# Patient Record
Sex: Female | Born: 1975 | Race: Black or African American | Hispanic: No | Marital: Single | Smoking: Current every day smoker
Health system: Southern US, Community
[De-identification: ages and names within clinical notes are randomized; demographics above are authoritative.]

## PROBLEM LIST (undated history)

## (undated) DIAGNOSIS — J45909 Unspecified asthma, uncomplicated: Secondary | ICD-10-CM

## (undated) DIAGNOSIS — D649 Anemia, unspecified: Secondary | ICD-10-CM

## (undated) HISTORY — PX: MULTIPLE TOOTH EXTRACTIONS: SHX2053

---

## 1997-10-11 ENCOUNTER — Inpatient Hospital Stay (HOSPITAL_COMMUNITY): Admission: AD | Admit: 1997-10-11 | Discharge: 1997-10-11 | Payer: Self-pay | Admitting: Obstetrics

## 1997-12-23 ENCOUNTER — Inpatient Hospital Stay (HOSPITAL_COMMUNITY): Admission: AD | Admit: 1997-12-23 | Discharge: 1997-12-25 | Payer: Self-pay | Admitting: *Deleted

## 1998-03-27 ENCOUNTER — Inpatient Hospital Stay (HOSPITAL_COMMUNITY): Admission: AD | Admit: 1998-03-27 | Discharge: 1998-03-27 | Payer: Self-pay | Admitting: *Deleted

## 1998-07-10 ENCOUNTER — Inpatient Hospital Stay (HOSPITAL_COMMUNITY): Admission: AD | Admit: 1998-07-10 | Discharge: 1998-07-10 | Payer: Self-pay | Admitting: *Deleted

## 1998-10-22 ENCOUNTER — Emergency Department (HOSPITAL_COMMUNITY): Admission: EM | Admit: 1998-10-22 | Discharge: 1998-10-22 | Payer: Self-pay | Admitting: Emergency Medicine

## 1999-06-17 ENCOUNTER — Inpatient Hospital Stay (HOSPITAL_COMMUNITY): Admission: AD | Admit: 1999-06-17 | Discharge: 1999-06-17 | Payer: Self-pay | Admitting: *Deleted

## 1999-12-22 ENCOUNTER — Inpatient Hospital Stay (HOSPITAL_COMMUNITY): Admission: AD | Admit: 1999-12-22 | Discharge: 1999-12-22 | Payer: Self-pay | Admitting: *Deleted

## 2000-03-15 ENCOUNTER — Inpatient Hospital Stay (HOSPITAL_COMMUNITY): Admission: AD | Admit: 2000-03-15 | Discharge: 2000-03-15 | Payer: Self-pay | Admitting: *Deleted

## 2000-09-28 ENCOUNTER — Observation Stay (HOSPITAL_COMMUNITY): Admission: AD | Admit: 2000-09-28 | Discharge: 2000-09-29 | Payer: Self-pay | Admitting: *Deleted

## 2001-07-11 ENCOUNTER — Emergency Department (HOSPITAL_COMMUNITY): Admission: EM | Admit: 2001-07-11 | Discharge: 2001-07-11 | Payer: Self-pay | Admitting: *Deleted

## 2001-09-10 ENCOUNTER — Emergency Department (HOSPITAL_COMMUNITY): Admission: EM | Admit: 2001-09-10 | Discharge: 2001-09-10 | Payer: Self-pay | Admitting: Emergency Medicine

## 2001-12-04 ENCOUNTER — Emergency Department (HOSPITAL_COMMUNITY): Admission: EM | Admit: 2001-12-04 | Discharge: 2001-12-04 | Payer: Self-pay | Admitting: Emergency Medicine

## 2002-07-01 ENCOUNTER — Emergency Department (HOSPITAL_COMMUNITY): Admission: EM | Admit: 2002-07-01 | Discharge: 2002-07-01 | Payer: Self-pay | Admitting: Emergency Medicine

## 2002-07-01 ENCOUNTER — Encounter: Payer: Self-pay | Admitting: Emergency Medicine

## 2002-10-05 ENCOUNTER — Emergency Department (HOSPITAL_COMMUNITY): Admission: EM | Admit: 2002-10-05 | Discharge: 2002-10-05 | Payer: Self-pay | Admitting: Emergency Medicine

## 2003-01-18 ENCOUNTER — Emergency Department (HOSPITAL_COMMUNITY): Admission: EM | Admit: 2003-01-18 | Discharge: 2003-01-18 | Payer: Self-pay | Admitting: Emergency Medicine

## 2003-10-21 ENCOUNTER — Emergency Department (HOSPITAL_COMMUNITY): Admission: EM | Admit: 2003-10-21 | Discharge: 2003-10-21 | Payer: Self-pay | Admitting: Emergency Medicine

## 2003-10-31 ENCOUNTER — Emergency Department (HOSPITAL_COMMUNITY): Admission: EM | Admit: 2003-10-31 | Discharge: 2003-10-31 | Payer: Self-pay | Admitting: Emergency Medicine

## 2004-05-20 ENCOUNTER — Emergency Department (HOSPITAL_COMMUNITY): Admission: EM | Admit: 2004-05-20 | Discharge: 2004-05-20 | Payer: Self-pay | Admitting: Emergency Medicine

## 2004-07-31 ENCOUNTER — Emergency Department (HOSPITAL_COMMUNITY): Admission: EM | Admit: 2004-07-31 | Discharge: 2004-07-31 | Payer: Self-pay | Admitting: *Deleted

## 2004-10-15 ENCOUNTER — Emergency Department (HOSPITAL_COMMUNITY): Admission: EM | Admit: 2004-10-15 | Discharge: 2004-10-15 | Payer: Self-pay | Admitting: Emergency Medicine

## 2004-11-03 ENCOUNTER — Emergency Department (HOSPITAL_COMMUNITY): Admission: EM | Admit: 2004-11-03 | Discharge: 2004-11-03 | Payer: Self-pay | Admitting: Emergency Medicine

## 2006-01-12 ENCOUNTER — Emergency Department (HOSPITAL_COMMUNITY): Admission: EM | Admit: 2006-01-12 | Discharge: 2006-01-12 | Payer: Self-pay | Admitting: Emergency Medicine

## 2006-04-10 ENCOUNTER — Inpatient Hospital Stay (HOSPITAL_COMMUNITY): Admission: AD | Admit: 2006-04-10 | Discharge: 2006-04-10 | Payer: Self-pay | Admitting: Obstetrics

## 2006-07-07 ENCOUNTER — Emergency Department (HOSPITAL_COMMUNITY): Admission: EM | Admit: 2006-07-07 | Discharge: 2006-07-07 | Payer: Self-pay | Admitting: Emergency Medicine

## 2006-07-21 ENCOUNTER — Inpatient Hospital Stay (HOSPITAL_COMMUNITY): Admission: AD | Admit: 2006-07-21 | Discharge: 2006-07-21 | Payer: Self-pay | Admitting: Obstetrics

## 2007-05-07 ENCOUNTER — Emergency Department (HOSPITAL_COMMUNITY): Admission: EM | Admit: 2007-05-07 | Discharge: 2007-05-08 | Payer: Self-pay | Admitting: Emergency Medicine

## 2008-05-31 ENCOUNTER — Inpatient Hospital Stay (HOSPITAL_COMMUNITY): Admission: AD | Admit: 2008-05-31 | Discharge: 2008-05-31 | Payer: Self-pay | Admitting: Obstetrics

## 2008-05-31 ENCOUNTER — Ambulatory Visit: Payer: Self-pay | Admitting: Physician Assistant

## 2008-05-31 ENCOUNTER — Encounter (INDEPENDENT_AMBULATORY_CARE_PROVIDER_SITE_OTHER): Payer: Self-pay | Admitting: Obstetrics

## 2010-05-01 ENCOUNTER — Emergency Department (HOSPITAL_COMMUNITY)
Admission: EM | Admit: 2010-05-01 | Discharge: 2010-05-01 | Payer: Self-pay | Source: Home / Self Care | Admitting: Emergency Medicine

## 2010-08-31 LAB — CBC
RBC: 4.58 MIL/uL (ref 3.87–5.11)
WBC: 8.7 10*3/uL (ref 4.0–10.5)

## 2010-08-31 LAB — HCG, QUANTITATIVE, PREGNANCY: hCG, Beta Chain, Quant, S: 6784 m[IU]/mL — ABNORMAL HIGH (ref <5)

## 2010-08-31 LAB — POCT PREGNANCY, URINE: Preg Test, Ur: POSITIVE

## 2010-08-31 LAB — ABO/RH: ABO/RH(D): B POS

## 2011-01-07 ENCOUNTER — Inpatient Hospital Stay (HOSPITAL_COMMUNITY): Payer: Medicaid Other

## 2011-01-07 ENCOUNTER — Encounter (HOSPITAL_COMMUNITY): Payer: Self-pay | Admitting: *Deleted

## 2011-01-07 ENCOUNTER — Inpatient Hospital Stay (HOSPITAL_COMMUNITY)
Admission: AD | Admit: 2011-01-07 | Discharge: 2011-01-07 | Disposition: A | Discharge status: Home / Self Care | Payer: Medicaid Other | Source: Ambulatory Visit | Attending: Obstetrics & Gynecology | Admitting: Obstetrics & Gynecology

## 2011-01-07 DIAGNOSIS — B9689 Other specified bacterial agents as the cause of diseases classified elsewhere: Secondary | ICD-10-CM

## 2011-01-07 DIAGNOSIS — A499 Bacterial infection, unspecified: Secondary | ICD-10-CM | POA: Insufficient documentation

## 2011-01-07 DIAGNOSIS — N760 Acute vaginitis: Secondary | ICD-10-CM | POA: Insufficient documentation

## 2011-01-07 DIAGNOSIS — O2390 Unspecified genitourinary tract infection in pregnancy, unspecified trimester: Secondary | ICD-10-CM | POA: Insufficient documentation

## 2011-01-07 DIAGNOSIS — O209 Hemorrhage in early pregnancy, unspecified: Secondary | ICD-10-CM

## 2011-01-07 LAB — URINALYSIS, ROUTINE W REFLEX MICROSCOPIC
Glucose, UA: NEGATIVE mg/dL
Leukocytes, UA: NEGATIVE
Protein, ur: NEGATIVE mg/dL
pH: 6.5 (ref 5.0–8.0)

## 2011-01-07 LAB — WET PREP, GENITAL

## 2011-01-07 LAB — CBC
HCT: 35.7 % — ABNORMAL LOW (ref 36.0–46.0)
MCV: 73.3 fL — ABNORMAL LOW (ref 78.0–100.0)
RDW: 13.8 % (ref 11.5–15.5)
WBC: 5.7 10*3/uL (ref 4.0–10.5)

## 2011-01-07 LAB — POCT PREGNANCY, URINE: Preg Test, Ur: POSITIVE

## 2011-01-07 MED ORDER — METRONIDAZOLE 500 MG PO TABS: 500.0000 mg | ORAL_TABLET | Freq: Two times a day (BID) | ORAL | Status: AC

## 2011-01-07 NOTE — Progress Notes (Signed)
Pt state she had a POS HPT on 8-12. Has had cramping and spotting off and on. No pain or bleeding at this time.Nausea, no vomiting.

## 2011-01-07 NOTE — Progress Notes (Signed)
Pt in c/o intermittent vaginal bleeding x1 week- states she is using 3 panty liners a day.  Reports passing "something that looks like tissue" last night.  Reports small amount of bleeding presently.  LMP 11/29/2010.  + UPT.  Reports lower abdominal intermittent cramping x1 week.

## 2011-01-07 NOTE — ED Provider Notes (Signed)
History     Chief Complaint  Patient presents with  . Vaginal Bleeding   HPI Pt with c/o intermittent vaginal bleeding x1 week.  Reports passing "something that looks like tissue" last night. Reports small amount of bleeding presently.  Also reports lower abdominal intermittent cramping x1 week.  Denies UTI symptoms.      Past Medical History  Diagnosis Date  . No pertinent past medical history     Past Surgical History  Procedure Date  . Multiple tooth extractions     No family history on file.  History  Substance Use Topics  . Smoking status: Current Everyday Smoker -- 0.2 packs/day    Types: Cigarettes  . Smokeless tobacco: Not on file  . Alcohol Use: No    Allergies:  Allergies  Allergen Reactions  . Latex Itching and Swelling  . Penicillins Itching    No prescriptions prior to admission    ROS No additional symptoms except for what was indicated in HPI.  Physical Exam   Blood pressure 116/80, pulse 90, temperature 98.4 F (36.9 C), temperature source Oral, height 5\' 5"  (1.651 m), weight 80.831 kg (178 lb 3.2 oz), last menstrual period 11/29/2010, SpO2 100.00%.  Physical Exam  Constitutional: She is oriented to person, place, and time. She appears well-developed and well-nourished.  HENT:  Head: Normocephalic.  Neck: Normal range of motion. Neck supple.  Cardiovascular: Normal rate, regular rhythm and normal heart sounds.  Exam reveals no gallop and no friction rub.   No murmur heard. Respiratory: Effort normal and breath sounds normal. No respiratory distress.  GI: Soft. She exhibits no mass. There is no tenderness. There is no rebound and no guarding.  Genitourinary: Cervix exhibits no motion tenderness. There is bleeding around the vagina.       Adnexa difficult to palpate secondary to weight  Neurological: She is alert and oriented to person, place, and time.  Skin: Skin is warm and dry.    MAU Course  Procedures  [place] Wet  Prep GC/CT BHCG CBC  Assessment and Plan  Bleeding During Pregnancy Bacterial Vaginosis  Plan: RX Flagyl Repeat BHCG in 48 hrs Repeat US in 7 days.  [facility] 01/07/2011, 5:17 PM

## 2011-01-09 ENCOUNTER — Inpatient Hospital Stay (HOSPITAL_COMMUNITY)
Admission: AD | Admit: 2011-01-09 | Discharge: 2011-01-09 | Disposition: A | Discharge status: Home / Self Care | Payer: Medicaid Other | Source: Ambulatory Visit | Attending: Obstetrics and Gynecology | Admitting: Obstetrics and Gynecology

## 2011-01-09 ENCOUNTER — Ambulatory Visit (HOSPITAL_COMMUNITY)
Admission: RE | Admit: 2011-01-09 | Discharge: 2011-01-09 | Disposition: A | Discharge status: Home / Self Care | Payer: Self-pay | Source: Ambulatory Visit | Attending: Obstetrics and Gynecology | Admitting: Obstetrics and Gynecology

## 2011-01-09 DIAGNOSIS — O00109 Unspecified tubal pregnancy without intrauterine pregnancy: Secondary | ICD-10-CM | POA: Insufficient documentation

## 2011-01-09 DIAGNOSIS — O039 Complete or unspecified spontaneous abortion without complication: Secondary | ICD-10-CM | POA: Insufficient documentation

## 2011-01-09 LAB — HCG, QUANTITATIVE, PREGNANCY: hCG, Beta Chain, Quant, S: 5385 m[IU]/mL — ABNORMAL HIGH (ref <5)

## 2011-01-09 NOTE — Progress Notes (Signed)
Bleeding is like a period and pain are both the same as two days ago.

## 2011-01-09 NOTE — ED Provider Notes (Signed)
History   The p tis a 35 year-old female who presents today for F/U Quant. She was seen in MAU two days ago for VB, passage of tissue and abd cramping. [place] unable to determine if IUP. No adnexal mass or free fluid  CSN: [id number] Arrival date & time: 01/09/2011 12:14 PM  Chief Complaint  Patient presents with  . Vaginal Bleeding  . Abdominal Pain   HPI  Past Medical History  Diagnosis Date  . No pertinent past medical history     Past Surgical History  Procedure Date  . Multiple tooth extractions     No family history on file.  History  Substance Use Topics  . Smoking status: Current Everyday Smoker -- 0.2 packs/day    Types: Cigarettes  . Smokeless tobacco: Not on file  . Alcohol Use: No    OB History    Grav Para Term Preterm Abortions TAB SAB Ect Mult Living   4 2 0 2 0 0 0 0 0 2       Review of Systems Lighter VB, no cramping Physical Exam  BP 121/73  Pulse 77  Temp(Src) 97.8 F (36.6 C) (Oral)  Resp 16  LMP 11/29/2010  Physical Exam Pelvic deferred  Bpos Quant 01/07/11 4096 Results for orders placed during the hospital encounter of 01/09/11 (from the past 24 hour(s))  HCG, QUANTITATIVE, PREGNANCY     Status: Abnormal   Collection Time   01/09/11 12:30 PM      Component Value Range   hCG, Beta Chain, Quant, S 5385 (*) <5 (mIU/mL)   ED Course  Procedures  Assessment 1.  SAB vs ectopic  Plan: 1. FU quant in 48 hours 2. Ectopic precautions 3. Support given

## 2011-01-10 NOTE — ED Provider Notes (Signed)
Agree with above note.  Melissa Villarreal 01/10/2011 6:07 AM

## 2011-01-11 ENCOUNTER — Ambulatory Visit (HOSPITAL_COMMUNITY)
Admission: RE | Admit: 2011-01-11 | Discharge: 2011-01-11 | Disposition: A | Discharge status: Home / Self Care | Payer: Self-pay | Source: Ambulatory Visit | Attending: Obstetrics & Gynecology | Admitting: Obstetrics & Gynecology

## 2011-01-11 ENCOUNTER — Inpatient Hospital Stay (HOSPITAL_COMMUNITY)
Admission: AD | Admit: 2011-01-11 | Discharge: 2011-01-11 | Disposition: A | Discharge status: Home / Self Care | Payer: Medicaid Other | Source: Ambulatory Visit | Attending: Obstetrics & Gynecology | Admitting: Obstetrics & Gynecology

## 2011-01-11 DIAGNOSIS — O289 Unspecified abnormal findings on antenatal screening of mother: Secondary | ICD-10-CM | POA: Insufficient documentation

## 2011-01-11 DIAGNOSIS — Z09 Encounter for follow-up examination after completed treatment for conditions other than malignant neoplasm: Secondary | ICD-10-CM | POA: Insufficient documentation

## 2011-01-11 LAB — HCG, QUANTITATIVE, PREGNANCY: hCG, Beta Chain, Quant, S: 7363 m[IU]/mL — ABNORMAL HIGH (ref <5)

## 2011-01-11 NOTE — ED Provider Notes (Signed)
History   Chief Complaint:  F/U quant  Melissa Villarreal is  35 y.o. [id number] Patient's last menstrual period was 11/29/2010.Marland Kitchen Patient is here for follow up of quantitative HCG and ongoing surveillance of pregnancy status.   She is [redacted]w[redacted]d weeks gestation with abnormally rising quants  Since her last visit, the patient is without new complaint.   The patient reports bleeding as  none now.   General ROS:  negative  Her previous Quantitative HCG values are: 01/07/11 4096 01/09/11 5385   Physical Exam   Blood pressure 121/72, pulse 77, temperature 98.3 F (36.8 C), temperature source Oral, resp. rate 16, last menstrual period 11/29/2010.  Focused Gynecological Exam: examination not indicated  Labs: Recent Results (from the past 24 hour(s))  HCG, QUANTITATIVE, PREGNANCY   Collection Time   01/11/11  2:21 PM      Component Value Range   hCG, Beta Chain, Quant, S 7363 (*) <5 (mIU/mL)    Assessment: [redacted]w[redacted]d weeks gestation, inappropriately rising quants   Plan: 1. Per consult w/ Dr. Debroah Loop F/U in MAU in 3 days for quant and [place] 2. Ectopic/SAB precautions  Adithya Difrancesco 01/11/2011, 4:13 PM

## 2011-01-14 ENCOUNTER — Inpatient Hospital Stay (HOSPITAL_COMMUNITY): Payer: Self-pay

## 2011-01-14 ENCOUNTER — Inpatient Hospital Stay (HOSPITAL_COMMUNITY)
Admission: AD | Admit: 2011-01-14 | Discharge: 2011-01-14 | Disposition: A | Discharge status: Home / Self Care | Payer: Self-pay | Source: Ambulatory Visit | Attending: Obstetrics and Gynecology | Admitting: Obstetrics and Gynecology

## 2011-01-14 ENCOUNTER — Encounter (HOSPITAL_COMMUNITY): Payer: Self-pay

## 2011-01-14 DIAGNOSIS — O4690 Antepartum hemorrhage, unspecified, unspecified trimester: Secondary | ICD-10-CM | POA: Insufficient documentation

## 2011-01-14 DIAGNOSIS — O99330 Smoking (tobacco) complicating pregnancy, unspecified trimester: Secondary | ICD-10-CM | POA: Insufficient documentation

## 2011-01-14 HISTORY — DX: Anemia, unspecified: D64.9

## 2011-01-14 LAB — HCG, QUANTITATIVE, PREGNANCY: hCG, Beta Chain, Quant, S: 9179 m[IU]/mL — ABNORMAL HIGH (ref <5)

## 2011-01-14 NOTE — Progress Notes (Signed)
Here for follow up only, having lower abdominal cramping still, intermittent spotting, like getting ready to start menstrual cycle.

## 2011-01-14 NOTE — ED Provider Notes (Signed)
History   Pt presents today for repeat B-quant and [place] secondary to abnormally rising B-quants. Pt states she has continued with mild cramping but her bleeding has improved greatly. She denies fever or any other problems at this time.  Chief Complaint  Patient presents with  . Follow-up   HPI  OB History    Grav Para Term Preterm Abortions TAB SAB Ect Mult Living   4 2 0 2 1 0 1 0 0 2       Past Medical History  Diagnosis Date  . Anemia     Past Surgical History  Procedure Date  . Multiple tooth extractions     No family history on file.  History  Substance Use Topics  . Smoking status: Current Everyday Smoker -- 0.2 packs/day    Types: Cigarettes  . Smokeless tobacco: Never Used  . Alcohol Use: No    Allergies:  Allergies  Allergen Reactions  . Latex Itching and Swelling  . Penicillins Itching    Prescriptions prior to admission  Medication Sig Dispense Refill  . metroNIDAZOLE (FLAGYL) 500 MG tablet Take 1 tablet (500 mg total) by mouth 2 (two) times daily.  14 tablet  0    Review of Systems  Constitutional: Negative for fever.  Cardiovascular: Negative for chest pain.  Gastrointestinal: Positive for abdominal pain. Negative for nausea, vomiting, diarrhea and constipation.  Genitourinary: Negative for dysuria, urgency, frequency and hematuria.  Neurological: Negative for dizziness and headaches.  Psychiatric/Behavioral: Negative for depression and suicidal ideas.   Physical Exam   Blood pressure 112/73, pulse 76, temperature 98.4 F (36.9 C), temperature source Oral, resp. rate 16, height 5\' 5"  (1.651 m), weight 177 lb 9.6 oz (80.559 kg), last menstrual period 11/29/2010, SpO2 100.00%.  Physical Exam  Constitutional: She is oriented to person, place, and time. She appears well-developed and well-nourished. No distress.  Eyes: EOM are normal. Pupils are equal, round, and reactive to light.  GI: Soft. She exhibits no distension. There is no tenderness.  There is no rebound and no guarding.  Neurological: She is alert and oriented to person, place, and time.  Skin: Skin is warm and dry. She is not diaphoretic.  Psychiatric: She has a normal mood and affect. Her behavior is normal. Judgment and thought content normal.    MAU Course  Procedures  Results for orders placed during the hospital encounter of 01/14/11 (from the past 24 hour(s))  HCG, QUANTITATIVE, PREGNANCY     Status: Abnormal   Collection Time   01/14/11  9:15 AM      Component Value Range   hCG, Beta Chain, Quant, S 9179 (*) <5 (mIU/mL)   US shows single IUP with cardiac activity.  Assessment and Plan  Pregnancy: pt has a confirmed IUP with cardiac activity. At this time her plan is to terminate the pregnancy. She states she will make an appt for this. Discussed diet, activity, risks, and precautions.  Clinton Gallant. Oletta Buehring III, DrHSc, MPAS, PA-C  01/14/2011, 11:29 AM

## 2011-01-14 NOTE — Progress Notes (Signed)
Pt to MAU for repeat BHCG and an ultrasound. Pt reports bleeding like a period and abdominal cramping

## 2011-01-14 NOTE — ED Provider Notes (Signed)
Agree with above note.  Blia Totman 01/14/2011 12:30 PM

## 2011-01-15 ENCOUNTER — Inpatient Hospital Stay (HOSPITAL_COMMUNITY)
Admission: AD | Admit: 2011-01-15 | Discharge: 2011-01-15 | Disposition: A | Discharge status: Home / Self Care | Payer: Self-pay | Source: Ambulatory Visit | Attending: Obstetrics and Gynecology | Admitting: Obstetrics and Gynecology

## 2011-01-15 ENCOUNTER — Inpatient Hospital Stay (HOSPITAL_COMMUNITY): Payer: Self-pay

## 2011-01-15 ENCOUNTER — Encounter (HOSPITAL_COMMUNITY): Payer: Self-pay

## 2011-01-15 DIAGNOSIS — O209 Hemorrhage in early pregnancy, unspecified: Secondary | ICD-10-CM

## 2011-01-15 DIAGNOSIS — O200 Threatened abortion: Secondary | ICD-10-CM | POA: Insufficient documentation

## 2011-01-15 LAB — CBC
HCT: 34.7 % — ABNORMAL LOW (ref 36.0–46.0)
Hemoglobin: 11.2 g/dL — ABNORMAL LOW (ref 12.0–15.0)
MCV: 73.4 fL — ABNORMAL LOW (ref 78.0–100.0)
Platelets: 247 10*3/uL (ref 150–400)
RBC: 4.73 MIL/uL (ref 3.87–5.11)
WBC: 7.8 10*3/uL (ref 4.0–10.5)

## 2011-01-15 MED ORDER — HYDROCODONE-ACETAMINOPHEN 5-500 MG PO TABS: 1.0000 | ORAL_TABLET | Freq: Four times a day (QID) | ORAL | Status: DC | PRN

## 2011-01-15 NOTE — Progress Notes (Signed)
Started during the night, cramping and bleeding, has passed clots.

## 2011-01-15 NOTE — ED Provider Notes (Signed)
History   Pt presents today c/o heavier vag bleeding. She has been seen multiple times with abnormally rising B-quant levels. However, US done yesterday did demonstrate an IUP with cardiac activity. Pt states that since that time, she has started to have heavier bleeding with some clots. She states she thinks she is having a miscarriage. She denies fever or any other sx at this time.  Chief Complaint  Patient presents with  . Vaginal Bleeding   HPI  OB History    Grav Para Term Preterm Abortions TAB SAB Ect Mult Living   4 2 0 2 1 0 1 0 0 2       Past Medical History  Diagnosis Date  . Anemia     Past Surgical History  Procedure Date  . Multiple tooth extractions     History reviewed. No pertinent family history.  History  Substance Use Topics  . Smoking status: Current Everyday Smoker -- 0.2 packs/day    Types: Cigarettes  . Smokeless tobacco: Never Used  . Alcohol Use: No    Allergies:  Allergies  Allergen Reactions  . Latex Itching and Swelling  . Penicillins Itching    Prescriptions prior to admission  Medication Sig Dispense Refill  . metroNIDAZOLE (FLAGYL) 500 MG tablet Take 1 tablet (500 mg total) by mouth 2 (two) times daily.  14 tablet  0    Review of Systems  Constitutional: Negative for fever.  Cardiovascular: Negative for chest pain.  Gastrointestinal: Positive for abdominal pain. Negative for nausea, vomiting, diarrhea and constipation.  Genitourinary: Negative for dysuria, urgency, frequency and hematuria.  Neurological: Negative for dizziness and headaches.  Psychiatric/Behavioral: Negative for depression and suicidal ideas.   Physical Exam   Blood pressure 123/75, pulse 89, temperature 98.1 F (36.7 C), temperature source Oral, resp. rate 20, height 5\' 5"  (1.651 m), weight 177 lb (80.287 kg), last menstrual period 11/29/2010.  Physical Exam  Constitutional: She is oriented to person, place, and time. She appears well-developed and  well-nourished. No distress.  HENT:  Head: Normocephalic and atraumatic.  Eyes: EOM are normal. Pupils are equal, round, and reactive to light.  GI: Soft. She exhibits no distension. There is no tenderness. There is no rebound and no guarding.  Genitourinary: There is bleeding around the vagina.       Cervix Lg/closed.  Neurological: She is alert and oriented to person, place, and time.  Skin: Skin is warm and dry. She is not diaphoretic.  Psychiatric: She has a normal mood and affect. Her behavior is normal. Judgment and thought content normal.    MAU Course  Procedures  Results for orders placed during the hospital encounter of 01/15/11 (from the past 24 hour(s))  CBC     Status: Abnormal   Collection Time   01/15/11  9:46 AM      Component Value Range   WBC 7.8  4.0 - 10.5 (K/uL)   RBC 4.73  3.87 - 5.11 (MIL/uL)   Hemoglobin 11.2 (*) 12.0 - 15.0 (g/dL)   HCT 65.7 (*) 84.6 - 46.0 (%)   MCV 73.4 (*) 78.0 - 100.0 (fL)   MCH 23.7 (*) 26.0 - 34.0 (pg)   MCHC 32.3  30.0 - 36.0 (g/dL)   RDW 96.2  95.2 - 84.1 (%)   Platelets 247  150 - 400 (K/uL)   [place] continues to show a single IUP with cardiac activity. However, the location of the gestational sac did appear to move lower in the uterine cavity during  the exam which is seen as a poor prognostic sign.  Assessment and Plan  Bleeding in preg: discussed with pt at length. With abnormally rising B-quants, vag bleeding, and change in gestational sac location, it is likely that the pt will miscarry. Pt is aware and agrees. She will f/u with her PCP. Discussed diet, activity, risks, and precautions.  Clinton Gallant. Jonte Shiller III, DrHSc, MPAS, PA-C  01/15/2011, 9:22 AM

## 2011-01-20 ENCOUNTER — Inpatient Hospital Stay (HOSPITAL_COMMUNITY)
Admission: AD | Admit: 2011-01-20 | Discharge: 2011-01-20 | Disposition: A | Discharge status: Home / Self Care | Payer: Self-pay | Source: Ambulatory Visit | Attending: Family Medicine | Admitting: Family Medicine

## 2011-01-20 ENCOUNTER — Inpatient Hospital Stay (HOSPITAL_COMMUNITY): Payer: Self-pay

## 2011-01-20 ENCOUNTER — Encounter (HOSPITAL_COMMUNITY): Payer: Self-pay | Admitting: *Deleted

## 2011-01-20 DIAGNOSIS — O039 Complete or unspecified spontaneous abortion without complication: Secondary | ICD-10-CM | POA: Insufficient documentation

## 2011-01-20 LAB — CBC
HCT: 32.1 % — ABNORMAL LOW (ref 36.0–46.0)
Hemoglobin: 10.2 g/dL — ABNORMAL LOW (ref 12.0–15.0)
MCHC: 31.8 g/dL (ref 30.0–36.0)
MCV: 74.1 fL — ABNORMAL LOW (ref 78.0–100.0)

## 2011-01-20 MED ORDER — FERROUS SULFATE 325 (65 FE) MG PO TABS: 325.0000 mg | ORAL_TABLET | Freq: Three times a day (TID) | ORAL | Status: DC

## 2011-01-20 NOTE — Progress Notes (Signed)
Thinks she miscarried on Mon (passed something, heavy bleeding and associated pain), lots of pain, was at home- children were there, so she didn't come in.  Pain has continued.   Still bleeding, changing q2 hrs.

## 2011-01-20 NOTE — ED Provider Notes (Signed)
History   Pt presents today c/o vag bleeding. She states she thinks she has had a miscarriage. She has been followed for abnormally rising B-quants and was thought to have had a SAB earlier in the preg. However, her last US demonstrated an IUP with cardiac activity. She states she passed a large amount of tissue on Monday and had a lot of pain. Since that time, her bleeding and pain has continued but is much improved. She denies fever or any other problems at this time.  Chief Complaint  Patient presents with  . Miscarriage   HPI  OB History    Grav Para Term Preterm Abortions TAB SAB Ect Mult Living   4 2 0 2 1 0 1 0 0 2       Past Medical History  Diagnosis Date  . Anemia     Past Surgical History  Procedure Date  . Multiple tooth extractions     No family history on file.  History  Substance Use Topics  . Smoking status: Current Everyday Smoker -- 0.2 packs/day    Types: Cigarettes  . Smokeless tobacco: Never Used  . Alcohol Use: No    Allergies:  Allergies  Allergen Reactions  . Latex Itching and Swelling  . Penicillins Itching    Prescriptions prior to admission  Medication Sig Dispense Refill  . HYDROcodone-acetaminophen (VICODIN) 5-500 MG per tablet Take 1 tablet by mouth every 6 (six) hours as needed for pain.  30 tablet  0    Review of Systems  Constitutional: Negative for fever.  Cardiovascular: Negative for chest pain.  Gastrointestinal: Negative for nausea, vomiting and abdominal pain.  Genitourinary: Negative for dysuria, urgency, frequency and hematuria.  Neurological: Negative for dizziness and headaches.  Psychiatric/Behavioral: Negative for depression and suicidal ideas.   Physical Exam   Blood pressure 108/71, pulse 88, temperature 98.6 F (37 C), temperature source Oral, resp. rate 20, height 5\' 5"  (1.651 m), weight 177 lb 3.2 oz (80.377 kg), last menstrual period 11/29/2010.  Physical Exam  Constitutional: She is oriented to person,  place, and time. She appears well-developed and well-nourished. No distress.  HENT:  Head: Normocephalic and atraumatic.  Eyes: EOM are normal. Pupils are equal, round, and reactive to light.  GI: Soft. She exhibits no distension. There is no tenderness. There is no rebound and no guarding.  Genitourinary: There is bleeding around the vagina.       Cervix is Lg/closed.  Neurological: She is alert and oriented to person, place, and time.  Skin: Skin is warm and dry. She is not diaphoretic.  Psychiatric: She has a normal mood and affect. Her behavior is normal. Judgment and thought content normal.    MAU Course  Procedures Results for orders placed during the hospital encounter of 01/20/11 (from the past 24 hour(s))  CBC     Status: Abnormal   Collection Time   01/20/11  9:45 AM      Component Value Range   WBC 5.4  4.0 - 10.5 (K/uL)   RBC 4.33  3.87 - 5.11 (MIL/uL)   Hemoglobin 10.2 (*) 12.0 - 15.0 (g/dL)   HCT 16.1 (*) 09.6 - 46.0 (%)   MCV 74.1 (*) 78.0 - 100.0 (fL)   MCH 23.6 (*) 26.0 - 34.0 (pg)   MCHC 31.8  30.0 - 36.0 (g/dL)   RDW 04.5  40.9 - 81.1 (%)   Platelets 231  150 - 400 (K/uL)    US shows interval completion of an SAB.  No evidence of retained products.  Assessment and Plan  Complete AB: discussed with pt at length. Will give Rx for iron supplementation. Discussed diet, activity, risks, and precautions.  Clinton Gallant. Lin Hackmann III, DrHSc, MPAS, PA-C  01/20/2011, 9:52 AM   Henrietta Hoover, PA 01/20/11 1049

## 2011-01-22 NOTE — ED Provider Notes (Signed)
Chart reviewed and agree with management and plan.  

## 2011-01-26 NOTE — Progress Notes (Signed)
Encounter addended by: Dorathy Kinsman, CNM on: 01/26/2011  8:34 AM<BR>     Documentation filed: Charges VN

## 2011-01-28 NOTE — ED Provider Notes (Signed)
Agree with above note.  Samanyu Tinnell 01/28/2011 8:30 AM   

## 2011-02-19 LAB — BASIC METABOLIC PANEL
BUN: 5 — ABNORMAL LOW
CO2: 21
Calcium: 8.6
Chloride: 104
Creatinine, Ser: 0.61
GFR calc Af Amer: >60
GFR calc non Af Amer: >60
Glucose, Bld: 95
Potassium: 3.3 — ABNORMAL LOW
Sodium: 132 — ABNORMAL LOW

## 2011-02-19 LAB — CBC
HCT: 30.7 — ABNORMAL LOW
Hemoglobin: 10.3 — ABNORMAL LOW
MCHC: 33.5
MCV: 71.6 — ABNORMAL LOW
Platelets: 211
RBC: 4.29
RDW: 13.2
WBC: 6.6

## 2011-02-19 LAB — WET PREP, GENITAL
Trich, Wet Prep: NONE SEEN
Yeast Wet Prep HPF POC: NONE SEEN

## 2011-02-19 LAB — DIFFERENTIAL
Basophils Absolute: 0.1
Basophils Relative: 1
Eosinophils Absolute: 0.1 — ABNORMAL LOW
Eosinophils Relative: 2
Lymphocytes Relative: 32
Lymphs Abs: 2.1
Monocytes Absolute: 0.7
Monocytes Relative: 11
Neutro Abs: 3.6
Neutrophils Relative %: 55

## 2011-02-19 LAB — URINALYSIS, ROUTINE W REFLEX MICROSCOPIC
Bilirubin Urine: NEGATIVE
Glucose, UA: NEGATIVE
Hgb urine dipstick: NEGATIVE
Ketones, ur: NEGATIVE
Protein, ur: NEGATIVE

## 2011-02-19 LAB — HCG, QUANTITATIVE, PREGNANCY: hCG, Beta Chain, Quant, S: 97181 — ABNORMAL HIGH

## 2011-03-15 ENCOUNTER — Emergency Department (HOSPITAL_COMMUNITY)
Admission: EM | Admit: 2011-03-15 | Discharge: 2011-03-15 | Disposition: A | Discharge status: Home / Self Care | Payer: Medicaid Other | Attending: Emergency Medicine | Admitting: Emergency Medicine

## 2011-03-15 DIAGNOSIS — R599 Enlarged lymph nodes, unspecified: Secondary | ICD-10-CM | POA: Insufficient documentation

## 2011-03-15 DIAGNOSIS — F17200 Nicotine dependence, unspecified, uncomplicated: Secondary | ICD-10-CM | POA: Insufficient documentation

## 2011-03-15 DIAGNOSIS — R05 Cough: Secondary | ICD-10-CM | POA: Insufficient documentation

## 2011-03-15 DIAGNOSIS — R0989 Other specified symptoms and signs involving the circulatory and respiratory systems: Secondary | ICD-10-CM | POA: Insufficient documentation

## 2011-03-15 DIAGNOSIS — R630 Anorexia: Secondary | ICD-10-CM | POA: Insufficient documentation

## 2011-03-15 DIAGNOSIS — R5381 Other malaise: Secondary | ICD-10-CM | POA: Insufficient documentation

## 2011-03-15 DIAGNOSIS — IMO0001 Reserved for inherently not codable concepts without codable children: Secondary | ICD-10-CM | POA: Insufficient documentation

## 2011-03-15 DIAGNOSIS — R0609 Other forms of dyspnea: Secondary | ICD-10-CM | POA: Insufficient documentation

## 2011-03-15 DIAGNOSIS — J020 Streptococcal pharyngitis: Secondary | ICD-10-CM | POA: Insufficient documentation

## 2011-03-15 DIAGNOSIS — R059 Cough, unspecified: Secondary | ICD-10-CM | POA: Insufficient documentation

## 2011-03-15 LAB — RAPID STREP SCREEN (MED CTR MEBANE ONLY): Streptococcus, Group A Screen (Direct): POSITIVE — AB

## 2011-08-15 ENCOUNTER — Emergency Department (HOSPITAL_COMMUNITY)
Admission: EM | Admit: 2011-08-15 | Discharge: 2011-08-15 | Disposition: A | Discharge status: Home / Self Care | Payer: Medicaid Other | Attending: Emergency Medicine | Admitting: Emergency Medicine

## 2011-08-15 ENCOUNTER — Emergency Department (HOSPITAL_COMMUNITY): Payer: Medicaid Other

## 2011-08-15 ENCOUNTER — Encounter (HOSPITAL_COMMUNITY): Payer: Self-pay

## 2011-08-15 DIAGNOSIS — R059 Cough, unspecified: Secondary | ICD-10-CM | POA: Insufficient documentation

## 2011-08-15 DIAGNOSIS — R05 Cough: Secondary | ICD-10-CM | POA: Insufficient documentation

## 2011-08-15 DIAGNOSIS — J069 Acute upper respiratory infection, unspecified: Secondary | ICD-10-CM | POA: Insufficient documentation

## 2011-08-15 DIAGNOSIS — R0789 Other chest pain: Secondary | ICD-10-CM | POA: Insufficient documentation

## 2011-08-15 MED ORDER — ALBUTEROL SULFATE HFA 108 (90 BASE) MCG/ACT IN AERS
2.0000 | INHALATION_SPRAY | RESPIRATORY_TRACT | Status: DC | PRN
  Administered 2011-08-15: 2 via RESPIRATORY_TRACT
  Filled 2011-08-15: qty 6.7

## 2011-08-15 MED ORDER — BENZONATATE 100 MG PO CAPS: 100.0000 mg | ORAL_CAPSULE | Freq: Three times a day (TID) | ORAL | Status: AC

## 2011-08-15 NOTE — ED Notes (Signed)
Chest pain and cough 4 days, also abdominal pain from the cough.  Pt. Sound  Congested and is having a dry cough

## 2011-08-15 NOTE — ED Provider Notes (Signed)
History     CSN: [id number]  Arrival date & time 08/15/11  0915   First MD Initiated Contact with Patient 08/15/11 1020      Chief Complaint  Patient presents with  . Cough    (Consider location/radiation/quality/duration/timing/severity/associated sxs/prior treatment) HPI   The patient presents to the emergency department with complaint of cough x4 days.. The patient states that she has been coughing so much she is not having abdominal pain and throat pain. She denies having fevers, chills nausea, vomiting, diarrhea. Patient denies having history of asthma or frequent upper respiratory infections. The patient is not a diabetic and has no other complaints at this time. The cough has been dry, and worse at night. Past Medical History  Diagnosis Date  . Anemia     Past Surgical History  Procedure Date  . Multiple tooth extractions     No family history on file.  History  Substance Use Topics  . Smoking status: Current Everyday Smoker -- 0.2 packs/day    Types: Cigarettes  . Smokeless tobacco: Never Used  . Alcohol Use: No    OB History    Grav Para Term Preterm Abortions TAB SAB Ect Mult Living   4 2 0 2 1 0 1 0 0 2       Review of Systems  All other systems reviewed and are negative.    Allergies  Latex and Penicillins  Home Medications   Current Outpatient Rx  Name Route Sig Dispense Refill  . GUAIFENESIN 100 MG/5ML PO LIQD Oral Take 200 mg by mouth 3 (three) times daily as needed. For cough    . IBUPROFEN 200 MG PO TABS Oral Take 400 mg by mouth every 6 (six) hours as needed. for pain    . MEDROXYPROGESTERONE ACETATE 150 MG/ML IM SUSP Intramuscular Inject 150 mg into the muscle every 3 (three) months. Last injection 1st part of February, 2013    . BENZONATATE 100 MG PO CAPS Oral Take 1 capsule (100 mg total) by mouth every 8 (eight) hours. 21 capsule 0    BP 107/74  Pulse 88  Temp(Src) 98.3 F (36.8 C) (Oral)  Resp 20  SpO2 100%  LMP 08/15/2011   Breastfeeding? Unknown  Physical Exam  Nursing note and vitals reviewed. Constitutional: She appears well-developed and well-nourished. No distress.  HENT:  Head: Normocephalic and atraumatic.  Eyes: Pupils are equal, round, and reactive to light.  Neck: Normal range of motion. Neck supple.  Cardiovascular: Normal rate and regular rhythm.   Pulmonary/Chest: Effort normal and breath sounds normal. No respiratory distress. She has no wheezes. She has no rales.  Abdominal: Soft.  Neurological: She is alert.  Skin: Skin is warm and dry.    ED Course  Procedures (including critical care time)  Labs Reviewed - No data to display Dg Chest 2 View  08/15/2011  *RADIOLOGY REPORT*  Clinical Data: History of cough for the past 4 days.  Congestion. Chest soreness.  CHEST - 2 VIEW  Comparison: Chest x-ray 05/01/2010.  Findings: Lung volumes are normal.  There are new linear opacities projecting over the region of the left lower lobe and right middle lobe, most compatible with areas of subsegmental atelectasis.  No definite pleural effusions.  Pulmonary vasculature is normal.  Mild thickening of the central bronchi is noted.  Cardiomediastinal silhouette is within normal limits.  IMPRESSION: 1.  Mild thickening of the central bronchi could suggest mild bronchitis.  There are areas of atelectasis in the left lower  lobe and right middle lobe.  Original Report Authenticated By: Florencia Reasons, M.D.     1. URI (upper respiratory infection)       MDM  Pt g iven albuterol inhaler in ED and Rx for tessalon perls  Pt has been advised of the symptoms that warrant their return to the ED. Patient has voiced understanding and has agreed to follow-up with the PCP or specialist.         Dorthula Matas, PA 08/15/11 1056

## 2011-08-15 NOTE — Discharge Instructions (Signed)
Cool Mist Vaporizers Vaporizers may help relieve the symptoms of a cough and cold. By adding water to the air, mucus may become thinner and less sticky. This makes it easier to breathe and cough up secretions. Vaporizers have not been proven to show they help with colds. You should not use a vaporizer if you are allergic to mold. Cool mist vaporizers do not cause serious burns like hot mist vaporizers ("steamers"). HOME CARE INSTRUCTIONS  Follow the package instructions for your vaporizer.   Use a vaporizer that holds a large volume of water (1 to 2 gallons [5.7 to 7.5 liters]).   Do not use anything other than distilled water in the vaporizer.   Do not run the vaporizer all of the time. This can cause mold or bacteria to grow in the vaporizer.   Clean the vaporizer after each time you use it.   Clean and dry the vaporizer well before you store it.   Stop using a vaporizer if you develop worsening respiratory symptoms.  Document Released: 01/29/2004 Document Revised: 04/22/2011 Document Reviewed: 12/26/2008 [facility] Patient Information 2012 [place], [place].Saline Nose Drops  To help clear a stuffy nose, put salt water (saline) nose drops in your infant's nose. This helps to loosen the secretions in the nose. Use a bulb syringe to clean the nose out:  Before feeding.   Before putting your infant down for naps.   No more than once every 3 hours to avoid irritating your infant's nostrils.  HOME CARE  Buy nose drops at your local drug store. You can also make nose drops yourself. Mix 1 cup of water with  teaspoon of salt. Stir. Store this mixture at room temperature. Make a new batch daily.   To use the drops:   Put 1 or 2 drops in each side of infant's nose with a clean medicine dropper. Do not use this dropper for any other medicine.   Squeeze the air out of the suction bulb before inserting it into your infant's nose.   While still squeezing the bulb flat, place the tip of the  bulb into a nostril. Let air come back into the bulb. The suction will pull snot out of the nose and into the bulb.   Repeat on other nostril.   Squeeze the bulb several times into a tissue and wash the bulb tip in soapy water. Store the bulb with the tip side down on paper towel.   Use the bulb syringe with only the saline drops to avoid irritating your infant's nostrils.  GET HELP RIGHT AWAY IF:  The snot changes to green or yellow.   The snot gets thicker.   Your infant is 3 months or younger with a rectal temperature of 100.4 F (38 C) or higher.   Your infant is older than 3 months with a rectal temperature of 102 F (38.9 C) or higher.   The stuffy nose lasts 10 days or longer.   There is trouble breathing or feeding.  MAKE SURE YOU:  Understand these instructions.   Will watch your infant's condition.   Will get help right away if your infant is not doing well or gets worse.  Document Released: 02/28/2009 Document Revised: 04/22/2011 Document Reviewed: 02/28/2009 [facility] Patient Information 2012 [place], [place].Upper Respiratory Infection, Adult An upper respiratory infection (URI) is also sometimes known as the common cold. The upper respiratory tract includes the nose, sinuses, throat, trachea, and bronchi. Bronchi are the airways leading to the lungs. Most people improve  within 1 week, but symptoms can last up to 2 weeks. A residual cough may last even longer.  CAUSES Many different viruses can infect the tissues lining the upper respiratory tract. The tissues become irritated and inflamed and often become very moist. Mucus production is also common. A cold is contagious. You can easily spread the virus to others by oral contact. This includes kissing, sharing a glass, coughing, or sneezing. Touching your mouth or nose and then touching a surface, which is then touched by another person, can also spread the virus. SYMPTOMS  Symptoms typically develop 1 to 3 days after  you come in contact with a cold virus. Symptoms vary from person to person. They may include:  Runny nose.   Sneezing.   Nasal congestion.   Sinus irritation.   Sore throat.   Loss of voice (laryngitis).   Cough.   Fatigue.   Muscle aches.   Loss of appetite.   Headache.   Low-grade fever.  DIAGNOSIS  You might diagnose your own cold based on familiar symptoms, since most people get a cold 2 to 3 times a year. Your caregiver can confirm this based on your exam. Most importantly, your caregiver can check that your symptoms are not due to another disease such as strep throat, sinusitis, pneumonia, asthma, or epiglottitis. Blood tests, throat tests, and X-rays are not necessary to diagnose a common cold, but they may sometimes be helpful in excluding other more serious diseases. Your caregiver will decide if any further tests are required. RISKS AND COMPLICATIONS  You may be at risk for a more severe case of the common cold if you smoke cigarettes, have chronic heart disease (such as heart failure) or lung disease (such as asthma), or if you have a weakened immune system. The very young and very old are also at risk for more serious infections. Bacterial sinusitis, middle ear infections, and bacterial pneumonia can complicate the common cold. The common cold can worsen asthma and chronic obstructive pulmonary disease (COPD). Sometimes, these complications can require emergency medical care and may be life-threatening. PREVENTION  The best way to protect against getting a cold is to practice good hygiene. Avoid oral or hand contact with people with cold symptoms. Wash your hands often if contact occurs. There is no clear evidence that vitamin C, vitamin E, echinacea, or exercise reduces the chance of developing a cold. However, it is always recommended to get plenty of rest and practice good nutrition. TREATMENT  Treatment is directed at relieving symptoms. There is no cure. Antibiotics  are not effective, because the infection is caused by a virus, not by bacteria. Treatment may include:  Increased fluid intake. Sports drinks offer valuable electrolytes, sugars, and fluids.   Breathing heated mist or steam (vaporizer or shower).   Eating chicken soup or other clear broths, and maintaining good nutrition.   Getting plenty of rest.   Using gargles or lozenges for comfort.   Controlling fevers with ibuprofen or acetaminophen as directed by your caregiver.   Increasing usage of your inhaler if you have asthma.  Zinc gel and zinc lozenges, taken in the first 24 hours of the common cold, can shorten the duration and lessen the severity of symptoms. Pain medicines may help with fever, muscle aches, and throat pain. A variety of non-prescription medicines are available to treat congestion and runny nose. Your caregiver can make recommendations and may suggest nasal or lung inhalers for other symptoms.  HOME CARE INSTRUCTIONS   Only  take over-the-counter or prescription medicines for pain, discomfort, or fever as directed by your caregiver.   Use a warm mist humidifier or inhale steam from a shower to increase air moisture. This may keep secretions moist and make it easier to breathe.   Drink enough water and fluids to keep your urine clear or pale yellow.   Rest as needed.   Return to work when your temperature has returned to normal or as your caregiver advises. You may need to stay home longer to avoid infecting others. You can also use a face mask and careful hand washing to prevent spread of the virus.  SEEK MEDICAL CARE IF:   After the first few days, you feel you are getting worse rather than better.   You need your caregiver's advice about medicines to control symptoms.   You develop chills, worsening shortness of breath, or brown or red sputum. These may be signs of pneumonia.   You develop yellow or brown nasal discharge or pain in the face, especially when you  bend forward. These may be signs of sinusitis.   You develop a fever, swollen neck glands, pain with swallowing, or white areas in the back of your throat. These may be signs of strep throat.  SEEK IMMEDIATE MEDICAL CARE IF:   You have a fever.   You develop severe or persistent headache, ear pain, sinus pain, or chest pain.   You develop wheezing, a prolonged cough, cough up blood, or have a change in your usual mucus (if you have chronic lung disease).   You develop sore muscles or a stiff neck.  Document Released: 10/27/2000 Document Revised: 04/22/2011 Document Reviewed: 09/04/2010 [facility] Patient Information 2012 [place], [place].

## 2011-08-16 NOTE — ED Provider Notes (Signed)
Medical screening examination/treatment/procedure(s) were performed by non-physician practitioner and as supervising physician I was immediately available for consultation/collaboration.   Luisana Lutzke III, MD 08/16/11 0946 

## 2012-05-18 ENCOUNTER — Inpatient Hospital Stay (HOSPITAL_COMMUNITY)
Admission: AD | Admit: 2012-05-18 | Discharge: 2012-05-18 | Disposition: A | Discharge status: Home / Self Care | Payer: Medicaid Other | Source: Ambulatory Visit | Attending: Obstetrics | Admitting: Obstetrics

## 2012-05-18 ENCOUNTER — Encounter (HOSPITAL_COMMUNITY): Payer: Self-pay | Admitting: *Deleted

## 2012-05-18 DIAGNOSIS — R109 Unspecified abdominal pain: Secondary | ICD-10-CM | POA: Insufficient documentation

## 2012-05-18 DIAGNOSIS — N938 Other specified abnormal uterine and vaginal bleeding: Secondary | ICD-10-CM | POA: Insufficient documentation

## 2012-05-18 DIAGNOSIS — N949 Unspecified condition associated with female genital organs and menstrual cycle: Secondary | ICD-10-CM

## 2012-05-18 LAB — WET PREP, GENITAL: Trich, Wet Prep: NONE SEEN

## 2012-05-18 LAB — URINALYSIS, ROUTINE W REFLEX MICROSCOPIC
Bilirubin Urine: NEGATIVE
Ketones, ur: NEGATIVE mg/dL
Nitrite: NEGATIVE
Urobilinogen, UA: 0.2 mg/dL (ref 0.0–1.0)
pH: 6 (ref 5.0–8.0)

## 2012-05-18 LAB — CBC
HCT: 35.2 % — ABNORMAL LOW (ref 36.0–46.0)
Hemoglobin: 10.9 g/dL — ABNORMAL LOW (ref 12.0–15.0)
RDW: 14.3 % (ref 11.5–15.5)
WBC: 4.9 10*3/uL (ref 4.0–10.5)

## 2012-05-18 LAB — URINE MICROSCOPIC-ADD ON

## 2012-05-18 NOTE — MAU Provider Note (Signed)
Attestation of Attending Supervision of Advanced Practitioner (CNM/NP): Evaluation and management procedures were performed by the Advanced Practitioner under my supervision and collaboration.  I have reviewed the Advanced Practitioner's note and chart, and I agree with the management and plan.  HARRAWAY-SMITH, Vandy Fong 5:38 PM     

## 2012-05-18 NOTE — MAU Note (Signed)
Patient states she had her last Depo in August or September and has had bleeding since that time. Sometime heavy then light with cramping.

## 2012-05-18 NOTE — MAU Provider Note (Signed)
History     CSN: [id number]  Arrival date and time: 05/18/12 4098   First Provider Initiated Contact with Patient 05/18/12 [phone]      Chief Complaint  Patient presents with  . Vaginal Bleeding  . Abdominal Cramping   HPI Ms. Melissa Villarreal is a 37 y.o. G4 P0222 who presents to MAU today with complaint of vaginal bleeding x ~1 year. The patient was on Depo and bled while on Depo. Her last injection was in August of September 2013 and she has been bleeding since then as well. The patient states that she was prescribed OCPs to help with the bleeding at that time, but has not noticed any improvement. The patient does have occasional abdominal cramping, but is not in any pain today. She denies other discharge. She does feel weak, tired and dizzy often. She also has occasional headaches.   OB History    Grav Para Term Preterm Abortions TAB SAB Ect Mult Living   4 2 0 2 2 0 2 0 0 2       Past Medical History  Diagnosis Date  . Anemia     Past Surgical History  Procedure Date  . Multiple tooth extractions     History reviewed. No pertinent family history.  History  Substance Use Topics  . Smoking status: Current Every Day Smoker -- 0.2 packs/day    Types: Cigarettes  . Smokeless tobacco: Never Used  . Alcohol Use: Yes     Comment: occasional alcohol    Allergies:  Allergies  Allergen Reactions  . Latex Itching and Swelling  . Penicillins Itching    Prescriptions prior to admission  Medication Sig Dispense Refill  . ibuprofen (ADVIL,MOTRIN) 200 MG tablet Take 400 mg by mouth every 6 (six) hours as needed. for pain      . norethindrone-ethinyl estradiol (MICROGESTIN,JUNEL,LOESTRIN) 1-20 MG-MCG tablet Take 1 tablet by mouth daily.      . [DISCONTINUED] guaiFENesin (ROBITUSSIN) 100 MG/5ML liquid Take 200 mg by mouth 3 (three) times daily as needed. For cough      . [DISCONTINUED] medroxyPROGESTERone (DEPO-PROVERA) 150 MG/ML injection Inject 150 mg into the muscle every 3  (three) months. Last injection 1st part of February, 2013        ROS All negative unless otherwise noted in HPI Physical Exam   Blood pressure 115/80, pulse 78, temperature 98.5 F (36.9 C), temperature source Oral, resp. rate 16, height 5\' 6"  (1.676 m), weight 187 lb 3.2 oz (84.913 kg).  Physical Exam  Constitutional: She is oriented to person, place, and time. She appears well-developed and well-nourished. No distress.  HENT:  Head: Normocephalic and atraumatic.  Cardiovascular: Normal rate, regular rhythm and normal heart sounds.   Respiratory: Effort normal and breath sounds normal. No respiratory distress.  GI: Soft. Bowel sounds are normal. She exhibits no distension and no mass. There is no tenderness. There is no rebound and no guarding.  Genitourinary: Vagina normal. Cervix exhibits no motion tenderness, no discharge (small amount of bright red blood in the vagina) and no friability. Right adnexum displays no mass and no tenderness. Left adnexum displays no mass and no tenderness.  Neurological: She is alert and oriented to person, place, and time.  Skin: Skin is warm and dry. No erythema.  Psychiatric: She has a normal mood and affect.   Results for orders placed during the hospital encounter of 05/18/12 (from the past 24 hour(s))  URINALYSIS, ROUTINE W REFLEX MICROSCOPIC     Status: Abnormal  Collection Time   05/18/12  8:07 AM      Component Value Range   Color, Urine RED (*) YELLOW   APPearance CLOUDY (*) CLEAR   Specific Gravity, Urine >1.030 (*) 1.005 - 1.030   pH 6.0  5.0 - 8.0   Glucose, UA NEGATIVE  NEGATIVE mg/dL   Hgb urine dipstick LARGE (*) NEGATIVE   Bilirubin Urine NEGATIVE  NEGATIVE   Ketones, ur NEGATIVE  NEGATIVE mg/dL   Protein, ur 30 (*) NEGATIVE mg/dL   Urobilinogen, UA 0.2  0.0 - 1.0 mg/dL   Nitrite NEGATIVE  NEGATIVE   Leukocytes, UA NEGATIVE  NEGATIVE  URINE MICROSCOPIC-ADD ON     Status: Abnormal   Collection Time   05/18/12  8:07 AM       Component Value Range   Squamous Epithelial / LPF MANY (*) RARE   WBC, UA 0-2  <3 WBC/hpf   RBC / HPF TOO NUMEROUS TO COUNT  <3 RBC/hpf   Bacteria, UA MANY (*) RARE   Urine-Other MUCOUS PRESENT    POCT PREGNANCY, URINE     Status: Normal   Collection Time   05/18/12  8:17 AM      Component Value Range   Preg Test, Ur NEGATIVE  NEGATIVE  WET PREP, GENITAL     Status: Abnormal   Collection Time   05/18/12  8:41 AM      Component Value Range   Yeast Wet Prep HPF POC NONE SEEN  NONE SEEN   Trich, Wet Prep NONE SEEN  NONE SEEN   Clue Cells Wet Prep HPF POC FEW (*) NONE SEEN   WBC, Wet Prep HPF POC MODERATE (*) NONE SEEN  CBC     Status: Abnormal   Collection Time   05/18/12  8:47 AM      Component Value Range   WBC 4.9  4.0 - 10.5 K/uL   RBC 4.65  3.87 - 5.11 MIL/uL   Hemoglobin 10.9 (*) 12.0 - 15.0 g/dL   HCT 16.1 (*) 09.6 - 04.5 %   MCV 75.7 (*) 78.0 - 100.0 fL   MCH 23.4 (*) 26.0 - 34.0 pg   MCHC 31.0  30.0 - 36.0 g/dL   RDW 40.9  81.1 - 91.4 %   Platelets 267  150 - 400 K/uL    MAU Course  Procedures None  MDM Discussed patient with Dr. Gaynell Face. He feels that she requires an endometrial biopsy and possibly a D&C hysteroscopy, all of which require evaluation in his office. He has asked that the patient call to make an appointment with him in the office.  Patient was upset that nothing was going to be done immediately, so I asked her to call Dr. Elsie Stain office to make an appointment before leaving. She did and he is going to see her in the office today.   Assessment and Plan  A: Dysfunctional uterine bleeding  P: Discharge home Patient to follow-up with Dr. Gaynell Face in his office today for further evaluation    Freddi Starr, PA-C 05/18/2012, 9:53 AM

## 2012-05-18 NOTE — MAU Note (Signed)
Pt had depo shot in August or September, has been bleeding ever since that time.  Sees Dr. Gaynell Face, was put on OCP in August to stop the bleeding but that didn't work.  Lower abd cramping.

## 2012-05-19 LAB — GC/CHLAMYDIA PROBE AMP
CT Probe RNA: NEGATIVE
GC Probe RNA: NEGATIVE

## 2012-07-13 ENCOUNTER — Other Ambulatory Visit (HOSPITAL_COMMUNITY): Payer: Self-pay | Admitting: Obstetrics

## 2012-07-13 DIAGNOSIS — D219 Benign neoplasm of connective and other soft tissue, unspecified: Secondary | ICD-10-CM

## 2012-07-13 DIAGNOSIS — Z1231 Encounter for screening mammogram for malignant neoplasm of breast: Secondary | ICD-10-CM

## 2012-07-20 ENCOUNTER — Other Ambulatory Visit (HOSPITAL_COMMUNITY): Payer: Medicaid Other

## 2012-07-21 ENCOUNTER — Ambulatory Visit (HOSPITAL_COMMUNITY): Payer: Medicaid Other

## 2012-07-28 ENCOUNTER — Ambulatory Visit (HOSPITAL_COMMUNITY): Payer: Medicaid Other

## 2012-07-28 ENCOUNTER — Ambulatory Visit (HOSPITAL_COMMUNITY): Payer: Medicaid Other | Attending: Obstetrics

## 2012-11-09 ENCOUNTER — Inpatient Hospital Stay (HOSPITAL_COMMUNITY)
Admission: AD | Admit: 2012-11-09 | Discharge: 2012-11-09 | Disposition: A | Discharge status: Home / Self Care | Payer: Medicaid Other | Source: Ambulatory Visit | Attending: Obstetrics | Admitting: Obstetrics

## 2012-11-09 ENCOUNTER — Encounter (HOSPITAL_COMMUNITY): Payer: Self-pay | Admitting: *Deleted

## 2012-11-09 DIAGNOSIS — N949 Unspecified condition associated with female genital organs and menstrual cycle: Secondary | ICD-10-CM | POA: Insufficient documentation

## 2012-11-09 DIAGNOSIS — L293 Anogenital pruritus, unspecified: Secondary | ICD-10-CM | POA: Insufficient documentation

## 2012-11-09 DIAGNOSIS — A5901 Trichomonal vulvovaginitis: Secondary | ICD-10-CM

## 2012-11-09 LAB — WET PREP, GENITAL

## 2012-11-09 MED ORDER — METRONIDAZOLE 500 MG PO TABS
2000.0000 mg | ORAL_TABLET | Freq: Once | ORAL | Status: AC
  Administered 2012-11-09: 2000 mg via ORAL
  Filled 2012-11-09: qty 4

## 2012-11-09 NOTE — MAU Note (Signed)
Pt reports vaginal itching and discharge for 2 days.

## 2012-11-09 NOTE — MAU Provider Note (Signed)
  History     CSN: [id number]  Arrival date and time: 11/09/12 2042   None     Chief Complaint  Patient presents with  . Vaginal Itching  . Vaginal Discharge   Vaginal Itching The patient's primary symptoms include a vaginal discharge.  Vaginal Discharge The patient's primary symptoms include a vaginal discharge.    Melissa Villarreal is a 37 y.o. [phone] who presents today with vaginal discharge with odor. She states that she has had this before and it was BV. She reports that she does douche on a regular basis.   Past Medical History  Diagnosis Date  . Anemia     Past Surgical History  Procedure Laterality Date  . Multiple tooth extractions      No family history on file.  History  Substance Use Topics  . Smoking status: Current Every Day Smoker -- 0.25 packs/day    Types: Cigarettes  . Smokeless tobacco: Never Used  . Alcohol Use: Yes     Comment: occasional alcohol    Allergies:  Allergies  Allergen Reactions  . Latex Itching and Swelling  . Penicillins Itching    Prescriptions prior to admission  Medication Sig Dispense Refill  . diphenhydrAMINE (BENADRYL) 25 MG tablet Take 25 mg by mouth every 8 (eight) hours as needed for itching or allergies.      Marland Kitchen ibuprofen (ADVIL,MOTRIN) 200 MG tablet Take 600 mg by mouth every 6 (six) hours as needed for headache. for pain        Review of Systems  Genitourinary: Positive for vaginal discharge.   Physical Exam   Blood pressure 120/74, pulse 77, temperature 98.6 F (37 C), temperature source Oral, resp. rate 18, height 5\' 5"  (1.651 m), weight 87.091 kg (192 lb), last menstrual period 10/24/2012, SpO2 100.00%.  Physical Exam  Nursing note and vitals reviewed. Constitutional: She appears well-developed and well-nourished. No distress.  Cardiovascular: Normal rate.   Respiratory: Effort normal.  GI: Soft. There is no tenderness.  Genitourinary:  .External: no lesion Vagina: copious amounts of thick  green/yellow discharge, tissues erythematous  Cervix: pink, smooth, no CMT Uterus: NSSC Adnexa: NT     MAU Course  Procedures  Results for orders placed during the hospital encounter of 11/09/12 (from the past 24 hour(s))  WET PREP, GENITAL     Status: Abnormal   Collection Time    11/09/12 10:15 PM      Result Value Range   Yeast Wet Prep HPF POC NONE SEEN  NONE SEEN   Trich, Wet Prep FEW (*) NONE SEEN   Clue Cells Wet Prep HPF POC NONE SEEN  NONE SEEN   WBC, Wet Prep HPF POC MODERATE (*) NONE SEEN     Assessment and Plan   1. Trichomonas vaginitis    Treated here in MAU Recommended partner treatment at the health department.   Tawnya Crook 11/09/2012, 10:23 PM

## 2012-11-09 NOTE — MAU Note (Signed)
Pt reports vaginal odor and itching x 2 days.

## 2013-08-02 ENCOUNTER — Encounter (HOSPITAL_COMMUNITY): Payer: Self-pay | Admitting: Emergency Medicine

## 2013-08-02 ENCOUNTER — Emergency Department (HOSPITAL_COMMUNITY)
Admission: EM | Admit: 2013-08-02 | Discharge: 2013-08-02 | Disposition: A | Discharge status: Home / Self Care | Payer: Medicaid Other | Attending: Emergency Medicine | Admitting: Emergency Medicine

## 2013-08-02 DIAGNOSIS — Z880 Allergy status to penicillin: Secondary | ICD-10-CM | POA: Insufficient documentation

## 2013-08-02 DIAGNOSIS — R590 Localized enlarged lymph nodes: Secondary | ICD-10-CM

## 2013-08-02 DIAGNOSIS — F17200 Nicotine dependence, unspecified, uncomplicated: Secondary | ICD-10-CM | POA: Insufficient documentation

## 2013-08-02 DIAGNOSIS — J029 Acute pharyngitis, unspecified: Secondary | ICD-10-CM | POA: Insufficient documentation

## 2013-08-02 DIAGNOSIS — Z91040 Latex allergy status: Secondary | ICD-10-CM | POA: Insufficient documentation

## 2013-08-02 DIAGNOSIS — R599 Enlarged lymph nodes, unspecified: Secondary | ICD-10-CM | POA: Insufficient documentation

## 2013-08-02 DIAGNOSIS — Z862 Personal history of diseases of the blood and blood-forming organs and certain disorders involving the immune mechanism: Secondary | ICD-10-CM | POA: Insufficient documentation

## 2013-08-02 MED ORDER — ACETAMINOPHEN 500 MG PO TABS: 500.0000 mg | ORAL_TABLET | Freq: Four times a day (QID) | ORAL | Status: DC | PRN

## 2013-08-02 MED ORDER — CLINDAMYCIN HCL 150 MG PO CAPS: 300.0000 mg | ORAL_CAPSULE | Freq: Three times a day (TID) | ORAL | Status: DC

## 2013-08-02 NOTE — ED Provider Notes (Signed)
CSN: [id number]     Arrival date & time 08/02/13  1447 History  This chart was scribed for non-physician practitioner working with Arbie Cookey, * by Stacy Gardner, ED scribe. This patient was seen in room WTR7/WTR7 and the patient's care was started at 4:05 PM.   First MD Initiated Contact with Patient 08/02/13 1529     Chief Complaint  Patient presents with  . Neck Pain     (Consider location/radiation/quality/duration/timing/severity/associated sxs/prior Treatment) Patient is a 38 y.o. female presenting with neck pain. The history is provided by the patient and medical records. No language interpreter was used.  Neck Pain  HPI Comments: GISSELLE GALVIS is a 38 y.o. female who presents to the Emergency Department complaining of constant, mild left sided neck pain, onset of one day ago while on lunch at her job. She has the associated symptoms of sore throat and trouble swallowing. The pain is worse with swallowing. Denies chest pain and cough.     Past Medical History  Diagnosis Date  . Anemia    Past Surgical History  Procedure Laterality Date  . Multiple tooth extractions     Family History  Problem Relation Age of Onset  . Asthma Daughter   . Asthma Son   . Cancer Maternal Grandmother     breast  . Hypertension Neg Hx   . Hyperlipidemia Neg Hx   . Heart disease Neg Hx   . Diabetes Neg Hx   . Stroke Neg Hx   . Mental illness Neg Hx    History  Substance Use Topics  . Smoking status: Current Every Day Smoker -- 0.25 packs/day    Types: Cigarettes  . Smokeless tobacco: Never Used  . Alcohol Use: Yes     Comment: occasional alcohol   OB History   Grav Para Term Preterm Abortions TAB SAB Ect Mult Living   4 2 0 2 2 0 2 0 0 2      Review of Systems  HENT: Positive for sore throat and trouble swallowing.   Musculoskeletal: Positive for neck pain.  All other systems reviewed and are negative.      Allergies  Latex and Penicillins  Home  Medications  No current outpatient prescriptions on file. BP 126/79  Pulse 79  Temp(Src) 98.5 F (36.9 C) (Oral)  Resp 18  SpO2 100%  LMP 07/14/2013 Physical Exam  Nursing note and vitals reviewed. Constitutional: She is oriented to person, place, and time. She appears well-developed and well-nourished. No distress.  HENT:  Head: Normocephalic and atraumatic.  Eyes: EOM are normal. Pupils are equal, round, and reactive to light.  Neck: Normal range of motion. Neck supple. No JVD present. No spinous process tenderness and no muscular tenderness present. No tracheal deviation, no edema, no erythema and normal range of motion present. No thyromegaly present.  Left neck side cervical adenoalopathy w/ tenderness to palpation Normal throat exam  Cardiovascular: Normal rate.   Pulmonary/Chest: Effort normal. No stridor. No respiratory distress.  Abdominal: Soft. She exhibits no distension.  Musculoskeletal: Normal range of motion.  Lymphadenopathy:    She has no cervical adenopathy.  Neurological: She is alert and oriented to person, place, and time.  Skin: Skin is warm and dry.  Psychiatric: She has a normal mood and affect. Her behavior is normal.    ED Course  Procedures (including critical care time) DIAGNOSTIC STUDIES: Oxygen Saturation is 100% on room air, normal by my interpretation.    COORDINATION OF CARE:  4:06 PM Discussed course of care with pt . Pt understands and agrees.    Labs Review Labs Reviewed - No data to display Imaging Review No results found.   EKG Interpretation None      MDM   Final diagnoses:  Cervical adenopathy    Patient will have tylenol for pain and clindamycin for possible bacteria infection. Vitals stable and patient afebrile.   I personally performed the services described in this documentation, which was scribed in my presence. The recorded information has been reviewed and is accurate.     Alvina Chou, PA-C 08/02/13  1814

## 2013-08-02 NOTE — Discharge Instructions (Signed)
Take clindamycin as directed if symptoms do not improve. Take tylenol as needed for pain. Refer to attached documents for more information.

## 2013-08-02 NOTE — ED Notes (Signed)
Pt reports lefts sided neck pain that she first noticed yesterday while at work. Pt states it hurts worse while swallowing. Pt is A/O x4, has an oxygen saturation of 100% on room air, is in NAD, and vitals are WDL.

## 2013-08-03 NOTE — ED Provider Notes (Signed)
Medical screening examination/treatment/procedure(s) were performed by non-physician practitioner and as supervising physician I was immediately available for consultation/collaboration.   Kathalene Frames, MD 08/03/13 [phone]

## 2014-03-18 ENCOUNTER — Encounter (HOSPITAL_COMMUNITY): Payer: Self-pay | Admitting: Emergency Medicine

## 2014-04-30 ENCOUNTER — Inpatient Hospital Stay (HOSPITAL_COMMUNITY)
Admission: AD | Admit: 2014-04-30 | Discharge: 2014-04-30 | Disposition: A | Discharge status: Home / Self Care | Payer: Medicaid Other | Source: Ambulatory Visit | Attending: Obstetrics | Admitting: Obstetrics

## 2014-04-30 NOTE — MAU Note (Signed)
Pt left stating she has waited for 2 hours to be evaluated. AMA form signed. Was told she may return at any time for evaluation.

## 2016-02-07 LAB — HM PAP SMEAR

## 2016-04-17 ENCOUNTER — Encounter (HOSPITAL_COMMUNITY): Payer: Self-pay | Admitting: Emergency Medicine

## 2016-04-17 ENCOUNTER — Emergency Department (HOSPITAL_COMMUNITY)
Admission: EM | Admit: 2016-04-17 | Discharge: 2016-04-17 | Disposition: A | Discharge status: Home / Self Care | Payer: Self-pay | Attending: Emergency Medicine | Admitting: Emergency Medicine

## 2016-04-17 DIAGNOSIS — Z91040 Latex allergy status: Secondary | ICD-10-CM | POA: Insufficient documentation

## 2016-04-17 DIAGNOSIS — F17210 Nicotine dependence, cigarettes, uncomplicated: Secondary | ICD-10-CM | POA: Insufficient documentation

## 2016-04-17 DIAGNOSIS — M5412 Radiculopathy, cervical region: Secondary | ICD-10-CM | POA: Insufficient documentation

## 2016-04-17 MED ORDER — PREDNISONE 20 MG PO TABS: ORAL_TABLET | ORAL | 0 refills | Status: DC

## 2016-04-17 NOTE — ED Notes (Signed)
ED Provider at bedside. 

## 2016-04-17 NOTE — ED Provider Notes (Signed)
[facility] DEPT Provider Note   CSN: [id number] Arrival date & time: 04/17/16  0810     History   Chief Complaint Chief Complaint  Patient presents with  . Numbness    rt. hand    HPI Melissa Villarreal is a 40 y.o. female.  HPI Patient has been having problems with numbness in her right arm for approximately 3 months. Symptoms are worse at night and when she awakens in the morning. They improve when she is up and active. She reports it is also worse in a seated position with the arm hanging at her side. She denies any weakness. She is not dropping things. She denies any significant associated pain but does have some discomfort in her shoulder intermittently. 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 She reports is most aggravating because is interfering with her nighttime sleep. She has had no treatment or evaluation for this condition. She has tried intermittent ibuprofen. No other associated areas of weakness numbness or tingling. No headaches or other neurologic symptoms. Past Medical History:  Diagnosis Date  . Anemia     There are no active problems to display for this patient.   Past Surgical History:  Procedure Laterality Date  . MULTIPLE TOOTH EXTRACTIONS      OB History    Gravida Para Term Preterm AB Living   4 2 0 2 2 2    SAB TAB Ectopic Multiple Live Births   2 0 0 0         Home Medications    Prior to Admission medications   Medication Sig Start Date End Date Taking? Authorizing Provider  ibuprofen (ADVIL,MOTRIN) 200 MG tablet Take 400 mg by mouth every 6 (six) hours as needed for headache or moderate pain.   Yes Historical Provider, MD  acetaminophen (TYLENOL) 500 MG tablet Take 1 tablet (500 mg total) by mouth every 6 (six) hours as needed. Patient not taking: Reported on 04/17/2016 08/02/13   Alvina Chou, PA-C  clindamycin (CLEOCIN) 150 MG capsule Take 2 capsules (300 mg total) by mouth 3 (three) times daily. May dispense as 150mg   capsules Patient not taking: Reported on 04/17/2016 08/02/13   Alvina Chou, PA-C  predniSONE (DELTASONE) 20 MG tablet 3 tabs po daily x 3 days, then 2 tabs x 3 days, then 1.5 tabs x 3 days, then 1 tab x 3 days, then 0.5 tabs x 3 days 04/17/16   Charlesetta Shanks, MD    Family History Family History  Problem Relation Age of Onset  . Asthma Daughter   . Asthma Son   . Cancer Maternal Grandmother     breast  . Hypertension Neg Hx   . Hyperlipidemia Neg Hx   . Heart disease Neg Hx   . Diabetes Neg Hx   . Stroke Neg Hx   . Mental illness Neg Hx     Social History Social History  Substance Use Topics  . Smoking status: Current Every Day Smoker    Packs/day: 0.25    Types: Cigarettes  . Smokeless tobacco: Never Used  . Alcohol use Yes     Comment: occasional alcohol     Allergies   Latex and Penicillins   Review of Systems Review of Systems 10 Systems reviewed and are negative for acute change except as noted in the HPI.   Physical Exam Updated Vital Signs BP 110/79 (BP Location: Left Arm)   Pulse 61   Temp 98 F (36.7 C) (Oral)   Resp 16  Ht 5\' 6"  (1.676 m)   LMP 04/03/2016   SpO2 100%   Physical Exam  Constitutional: She is oriented to person, place, and time. She appears well-developed and well-nourished. No distress.  HENT:  Head: Normocephalic and atraumatic.  Eyes: EOM are normal.  Neck: Normal range of motion. Neck supple.  No significant reproducible cervical pain to palpation.  Cardiovascular: Normal rate, regular rhythm, normal heart sounds and intact distal pulses.   Pulmonary/Chest: Effort normal and breath sounds normal.  Abdominal: She exhibits no distension.  Musculoskeletal: Normal range of motion. She exhibits no edema, tenderness or deformity.  Neurological: She is alert and oriented to person, place, and time. No sensory deficit. She exhibits normal muscle tone. Coordination normal.  Patient has excellent strength of the upper extremity.  Normal push pulling grip. Patient does not endorse sensory differential to light touch between the 2 upper extremities.  Skin: Skin is warm and dry.  Psychiatric: She has a normal mood and affect.     ED Treatments / Results  Labs (all labs ordered are listed, but only abnormal results are displayed) Labs Reviewed - No data to display  EKG  EKG Interpretation None       Radiology No results found.  Procedures Procedures (including critical care time)  Medications Ordered in ED Medications - No data to display   Initial Impression / Assessment and Plan / ED Course  I have reviewed the triage vital signs and the nursing notes.  Pertinent labs & imaging results that were available during my care of the patient were reviewed by me and considered in my medical decision making (see chart for details).  Clinical Course     Final Clinical Impressions(s) / ED Diagnoses   Final diagnoses:  Cervical radiculopathy   Patient has symptoms consistent with a high radiculopathy that I suspect is cervical. Feeling of numbness encompasses all of the right arm without motor weakness or sensory loss. Occurs predominantly at night and in particular positions. No abnormality on neurovascular examination. Swelling or pain. Plan will be for prednisone taper and follow-up with neurosurgery for reexamination. New Prescriptions New Prescriptions   PREDNISONE (DELTASONE) 20 MG TABLET    3 tabs po daily x 3 days, then 2 tabs x 3 days, then 1.5 tabs x 3 days, then 1 tab x 3 days, then 0.5 tabs x 3 days     Charlesetta Shanks, MD 04/17/16 1016

## 2016-04-17 NOTE — ED Triage Notes (Signed)
Pt. Stated, my rt hand has been numb for over 3 months.

## 2017-02-15 ENCOUNTER — Ambulatory Visit (HOSPITAL_COMMUNITY)
Admission: EM | Admit: 2017-02-15 | Discharge: 2017-02-15 | Disposition: A | Discharge status: Home / Self Care | Payer: Self-pay

## 2018-08-07 ENCOUNTER — Encounter (HOSPITAL_COMMUNITY): Payer: Self-pay | Admitting: Emergency Medicine

## 2018-08-07 ENCOUNTER — Other Ambulatory Visit: Payer: Self-pay

## 2018-08-07 ENCOUNTER — Ambulatory Visit (HOSPITAL_COMMUNITY)
Admission: EM | Admit: 2018-08-07 | Discharge: 2018-08-07 | Disposition: A | Discharge status: Home / Self Care | Payer: BLUE CROSS/BLUE SHIELD | Attending: Family Medicine | Admitting: Family Medicine

## 2018-08-07 ENCOUNTER — Ambulatory Visit (INDEPENDENT_AMBULATORY_CARE_PROVIDER_SITE_OTHER): Payer: BLUE CROSS/BLUE SHIELD

## 2018-08-07 DIAGNOSIS — R079 Chest pain, unspecified: Secondary | ICD-10-CM

## 2018-08-07 DIAGNOSIS — J40 Bronchitis, not specified as acute or chronic: Secondary | ICD-10-CM

## 2018-08-07 DIAGNOSIS — R0602 Shortness of breath: Secondary | ICD-10-CM

## 2018-08-07 DIAGNOSIS — R05 Cough: Secondary | ICD-10-CM

## 2018-08-07 MED ORDER — ALBUTEROL SULFATE HFA 108 (90 BASE) MCG/ACT IN AERS: 1.0000 | INHALATION_SPRAY | Freq: Four times a day (QID) | RESPIRATORY_TRACT | 0 refills | Status: DC | PRN

## 2018-08-07 MED ORDER — DM-GUAIFENESIN ER 30-600 MG PO TB12: 1.0000 | ORAL_TABLET | Freq: Two times a day (BID) | ORAL | 0 refills | Status: DC

## 2018-08-07 MED ORDER — BENZONATATE 100 MG PO CAPS: 100.0000 mg | ORAL_CAPSULE | Freq: Three times a day (TID) | ORAL | 0 refills | Status: DC

## 2018-08-07 MED ORDER — PREDNISONE 10 MG PO TABS: 40.0000 mg | ORAL_TABLET | Freq: Every day | ORAL | 0 refills | Status: AC

## 2018-08-07 NOTE — ED Notes (Signed)
Dr Meda Coffee is ok with patient waiting in the lobby for treatment room to become available

## 2018-08-07 NOTE — Discharge Instructions (Signed)
We are treating you for bronchitis Prednisone daily for the next 5 days for lung inflammation Albuterol inhaler to use every 6 hours as needed for cough, wheezing, shortness of breath Tessalon Perles every 8 hours as needed for cough Mucinex DM for cough, congestion and to thin mucus Follow up as needed for continued or worsening symptoms

## 2018-08-07 NOTE — ED Triage Notes (Signed)
08/03/2018 sudden onset of symptoms.  Patient has drainage in throat, feeling the need to frequently clear throat.  Patient reports sob, particularly in the morning.  Denies noticing wheezing.  Denies nausea, denies vomiting  Tight, sore in center, upper chest.

## 2018-08-07 NOTE — ED Provider Notes (Signed)
[facility]    CSN: [id number] Arrival date & time: 08/07/18  1309     History   Chief Complaint Chief Complaint  Patient presents with  . URI    HPI Melissa Villarreal is a 43 y.o. female.   Patient is a 43 year old female that presents with postnasal drip, cough, congestion, mild shortness of breath.  She is having some right-sided chest pain with breathing and palpation.  Symptoms have been constant over the past 4 days.  She has not take anything for her symptoms.  She denies any associated fevers, chills, night sweats, hemoptysis, nausea, vomiting.  Denies any recent traveling or sick contacts.  No history of DVT, PE.  She is not currently on any hormone replacement. She does smoke. No hx of asthma.   ROS per HPI      Past Medical History:  Diagnosis Date  . Anemia     There are no active problems to display for this patient.   Past Surgical History:  Procedure Laterality Date  . MULTIPLE TOOTH EXTRACTIONS      OB History    Gravida  4   Para  2   Term  0   Preterm  2   AB  2   Living  2     SAB  2   TAB  0   Ectopic  0   Multiple  0   Live Births               Home Medications    Prior to Admission medications   Medication Sig Start Date End Date Taking? Authorizing Provider  albuterol (PROVENTIL HFA;VENTOLIN HFA) 108 (90 Base) MCG/ACT inhaler Inhale 1-2 puffs into the lungs every 6 (six) hours as needed for wheezing or shortness of breath. 08/07/18   Greenly Rarick A, NP  benzonatate (TESSALON) 100 MG capsule Take 1 capsule (100 mg total) by mouth every 8 (eight) hours. 08/07/18   Ginnifer Creelman, Tressia Miners A, NP  dextromethorphan-guaiFENesin (MUCINEX DM) 30-600 MG 12hr tablet Take 1 tablet by mouth 2 (two) times daily. 08/07/18   Loura Halt A, NP  ibuprofen (ADVIL,MOTRIN) 200 MG tablet Take 400 mg by mouth every 6 (six) hours as needed for headache or moderate pain.    [provider]  predniSONE (DELTASONE) 10 MG tablet Take 4 tablets  (40 mg total) by mouth daily for 5 days. 08/07/18 08/12/18  Orvan July, NP    Family History Family History  Problem Relation Age of Onset  . Asthma Daughter   . Asthma Son   . Cancer Maternal Grandmother        breast  . Hypertension Neg Hx   . Hyperlipidemia Neg Hx   . Heart disease Neg Hx   . Diabetes Neg Hx   . Stroke Neg Hx   . Mental illness Neg Hx     Social History Social History   Tobacco Use  . Smoking status: Current Every Day Smoker    Packs/day: 0.25    Types: Cigarettes  . Smokeless tobacco: Never Used  Substance Use Topics  . Alcohol use: Yes    Comment: occasional alcohol  . Drug use: Yes    Types: Marijuana     Allergies   Latex and Penicillins   Review of Systems Review of Systems   Physical Exam Triage Vital Signs ED Triage Vitals  Enc Vitals Group     BP 08/07/18 1335 120/65     Pulse Rate 08/07/18  1335 72     Resp 08/07/18 1335 18     Temp 08/07/18 1335 98 F (36.7 C)     Temp Source 08/07/18 1335 Oral     SpO2 08/07/18 1335 100 %     Weight --      Height --      Head Circumference --      Peak Flow --      Pain Score 08/07/18 1333 4     Pain Loc --      Pain Edu? --      Excl. in [place]? --    No data found.  Updated Vital Signs BP 120/65 (BP Location: Right Arm)   Pulse 72   Temp 98 F (36.7 C) (Oral)   Resp 18   LMP 07/29/2018 (Exact Date)   SpO2 100%   Visual Acuity Right Eye Distance:   Left Eye Distance:   Bilateral Distance:    Right Eye Near:   Left Eye Near:    Bilateral Near:     Physical Exam Vitals signs and nursing note reviewed.  Constitutional:      Appearance: Normal appearance. She is not toxic-appearing.  HENT:     Head: Normocephalic and atraumatic.     Nose: Congestion and rhinorrhea present.  Eyes:     Conjunctiva/sclera: Conjunctivae normal.  Neck:     Musculoskeletal: Normal range of motion.  Cardiovascular:     Rate and Rhythm: Normal rate and regular rhythm.  Pulmonary:      Effort: Pulmonary effort is normal.     Comments: Coarse in all fields.  Musculoskeletal: Normal range of motion.        General: Tenderness present.       Arms:     Comments: Tender to deep palpation of right chest wall  Skin:    General: Skin is warm and dry.     Findings: No rash.  Neurological:     Mental Status: She is alert.  Psychiatric:        Mood and Affect: Mood normal.      UC Treatments / Results  Labs (all labs ordered are listed, but only abnormal results are displayed) Labs Reviewed - No data to display  EKG None  Radiology Dg Chest 2 View  Result Date: 08/07/2018 CLINICAL DATA:  Cough, chest pain, and shortness of breath. EXAM: CHEST - 2 VIEW COMPARISON:  Chest x-ray dated August 15, 2011. FINDINGS: The heart size and mediastinal contours are within normal limits. Normal pulmonary vascularity. Slightly coarsened interstitial markings and mild peribronchial thickening, new when compared to prior study. No focal consolidation, pleural effusion, or pneumothorax. Unchanged scarring in lingula. No acute osseous abnormality. IMPRESSION: 1. Slightly coarsened interstitial markings and mild peribronchial thickening, new when compared to prior study, likely smoking-related. Electronically Signed   By: Titus Dubin M.D.   On: 08/07/2018 14:29    Procedures Procedures (including critical care time)  Medications Ordered in UC Medications - No data to display  Initial Impression / Assessment and Plan / UC Course  I have reviewed the triage vital signs and the nursing notes.  Pertinent labs & imaging results that were available during my care of the patient were reviewed by me and considered in my medical decision making (see chart for details).     Symptoms and exam consistent with bronchitis EKG without any acute abnormalities Chest pain reproducible Will treat with prednisone daily for 5 days.  Albuterol inhaler to use as needed  for cough, wheezing, shortness  of breath. Tessalon Perles as needed for cough Mucinex DM for cough, congestion and thin mucus Follow up as needed for continued or worsening symptoms  Final Clinical Impressions(s) / UC Diagnoses   Final diagnoses:  Bronchitis     Discharge Instructions     We are treating you for bronchitis Prednisone daily for the next 5 days for lung inflammation Albuterol inhaler to use every 6 hours as needed for cough, wheezing, shortness of breath Tessalon Perles every 8 hours as needed for cough Mucinex DM for cough, congestion and to thin mucus Follow up as needed for continued or worsening symptoms    ED Prescriptions    Medication Sig Dispense Auth. Provider   predniSONE (DELTASONE) 10 MG tablet Take 4 tablets (40 mg total) by mouth daily for 5 days. 20 tablet Alyric Parkin A, NP   benzonatate (TESSALON) 100 MG capsule Take 1 capsule (100 mg total) by mouth every 8 (eight) hours. 21 capsule Leopoldo Mazzie A, NP   albuterol (PROVENTIL HFA;VENTOLIN HFA) 108 (90 Base) MCG/ACT inhaler Inhale 1-2 puffs into the lungs every 6 (six) hours as needed for wheezing or shortness of breath. 1 Inhaler Gricelda Foland A, NP   dextromethorphan-guaiFENesin (MUCINEX DM) 30-600 MG 12hr tablet Take 1 tablet by mouth 2 (two) times daily. 14 tablet Loura Halt A, NP     Controlled Substance Prescriptions [place] Controlled Substance Registry consulted? Not Applicable   Orvan July, NP 08/07/18 [phone]

## 2020-05-12 ENCOUNTER — Encounter (HOSPITAL_COMMUNITY): Payer: Self-pay | Admitting: Emergency Medicine

## 2020-05-12 ENCOUNTER — Ambulatory Visit (HOSPITAL_COMMUNITY)
Admission: EM | Admit: 2020-05-12 | Discharge: 2020-05-12 | Disposition: A | Discharge status: Home / Self Care | Payer: Self-pay | Attending: Family Medicine | Admitting: Family Medicine

## 2020-05-12 ENCOUNTER — Other Ambulatory Visit: Payer: Self-pay

## 2020-05-12 DIAGNOSIS — G5601 Carpal tunnel syndrome, right upper limb: Secondary | ICD-10-CM

## 2020-05-12 HISTORY — DX: Unspecified asthma, uncomplicated: J45.909

## 2020-05-12 MED ORDER — METHYLPREDNISOLONE 4 MG PO TBPK: ORAL_TABLET | ORAL | 0 refills | Status: DC

## 2020-05-12 NOTE — ED Triage Notes (Signed)
Patient c/o RT hand numbness x 2 years.   Patient endorses pain has worsened recently.   Patient endorses increased pain while trying to sleep.   Patient endorses "I have to shake my hand up to get the numbness out".   Patient has used Ibuprofen w/ no relief of symptoms.

## 2020-05-12 NOTE — Discharge Instructions (Addendum)
Take the prednisone as directed Take all of day one today Wear brace at night You can be seen by orthopedic without referral, Name attached May take aleve 2 tabs morning and night after the medrol is done

## 2020-05-12 NOTE — ED Provider Notes (Signed)
[facility]    CSN: [id number] Arrival date & time: 05/12/20  [id number]      History   Chief Complaint Chief Complaint  Patient presents with  . Numbness    HPI Melissa Villarreal is a 44 y.o. female.   HPI   Patient is here for right arm and hand numbness.  She states that it awakens her at night.  She states is been going on for 2 years, worse for the last week.  She states she would not go to her to sleep last night because of the pain.  She states it "hurts its in her whole hand.  Indicates all 5 fingers.  No weakness.  No loss of dexterity or use of the hand.  No injury.  No neck pain.  No neck injury.  She thinks is from when she used to work at [employer].  She no longer works there.  She cannot describe any repetitive activity, discussed use of her hands and a material handling capacity.  Patient is taking ibuprofen with minimal relief. Patient states that she is in good health.  On no prescription medications Denies pregnancy.  Denies diabetes  Past Medical History:  Diagnosis Date  . Anemia   . Asthma     There are no problems to display for this patient.   Past Surgical History:  Procedure Laterality Date  . MULTIPLE TOOTH EXTRACTIONS      OB History    Gravida  4   Para  2   Term  0   Preterm  2   AB  2   Living  2     SAB  2   IAB  0   Ectopic  0   Multiple  0   Live Births               Home Medications    Prior to Admission medications   Medication Sig Start Date End Date Taking? Authorizing Provider  ibuprofen (ADVIL,MOTRIN) 200 MG tablet Take 400 mg by mouth every 6 (six) hours as needed for headache or moderate pain.   Yes [provider]  methylPREDNISolone (MEDROL DOSEPAK) 4 MG TBPK tablet TAD 05/12/20  Yes Raylene Everts, MD  albuterol (PROVENTIL HFA;VENTOLIN HFA) 108 (90 Base) MCG/ACT inhaler Inhale 1-2 puffs into the lungs every 6 (six) hours as needed for wheezing or shortness of breath. 08/07/18   Orvan July, NP    Family History Family History  Problem Relation Age of Onset  . Asthma Daughter   . Asthma Son   . Cancer Maternal Grandmother        breast  . Hypertension Neg Hx   . Hyperlipidemia Neg Hx   . Heart disease Neg Hx   . Diabetes Neg Hx   . Stroke Neg Hx   . Mental illness Neg Hx     Social History Social History   Tobacco Use  . Smoking status: Current Every Day Smoker    Packs/day: 0.25    Types: Cigarettes  . Smokeless tobacco: Never Used  Substance Use Topics  . Alcohol use: Yes    Comment: occasional alcohol  . Drug use: Yes    Types: Marijuana     Allergies   Latex and Penicillins   Review of Systems Review of Systems See HPI  Physical Exam Triage Vital Signs ED Triage Vitals  Enc Vitals Group     BP 05/12/20 0920 134/89     Pulse  Rate 05/12/20 0920 77     Resp 05/12/20 0920 17     Temp 05/12/20 0920 98 F (36.7 C)     Temp Source 05/12/20 0920 Oral     SpO2 05/12/20 0920 100 %     Weight 05/12/20 0917 170 lb 3.2 oz (77.2 kg)     Height --      Head Circumference --      Peak Flow --      Pain Score 05/12/20 0917 8     Pain Loc --      Pain Edu? --      Excl. in [place]? --    No data found.  Updated Vital Signs BP 134/89 (BP Location: Left Arm)   Pulse 77   Temp 98 F (36.7 C) (Oral)   Resp 17   Wt 77.2 kg   LMP 04/25/2020   SpO2 100%   BMI 27.47 kg/m       Physical Exam Constitutional:      General: She is not in acute distress.    Appearance: She is well-developed, normal weight and well-nourished.  HENT:     Head: Normocephalic and atraumatic.     Mouth/Throat:     Mouth: Oropharynx is clear and moist.  Eyes:     Conjunctiva/sclera: Conjunctivae normal.     Pupils: Pupils are equal, round, and reactive to light.  Cardiovascular:     Rate and Rhythm: Normal rate.  Pulmonary:     Effort: Pulmonary effort is normal. No respiratory distress.  Abdominal:     General: There is no distension.     Palpations: Abdomen  is soft.  Musculoskeletal:        General: No edema. Normal range of motion.     Cervical back: Normal range of motion.     Comments: Neck is nontender.  Full range of motion.  Spurling negative.  No tenderness or limitation the shoulder elbow.  No tenderness over ulnar nerve at the elbow.  Patient has no tenderness in the carpal region.  Good grip strength bilaterally.  No numbness in right hand.  Phalen's positive.  Tinel's positive.  They both cause increased tingling in the thumb index and long fingers only.  No muscular atrophy thenar eminence.  Skin:    General: Skin is warm and dry.  Neurological:     General: No focal deficit present.     Mental Status: She is alert.     Sensory: No sensory deficit.     Motor: No weakness.     Coordination: Coordination normal.     Deep Tendon Reflexes: Reflexes normal.      UC Treatments / Results  Labs (all labs ordered are listed, but only abnormal results are displayed) Labs Reviewed - No data to display  EKG   Radiology No results found.  Procedures Procedures (including critical care time)  Medications Ordered in UC Medications - No data to display  Initial Impression / Assessment and Plan / UC Course  I have reviewed the triage vital signs and the nursing notes.  Pertinent labs & imaging results that were available during my care of the patient were reviewed by me and considered in my medical decision making (see chart for details).     Reviewed that patient likely has carpal tunnel syndrome.  Will refer to hand specialty.  I told her I do not believe she would have a valid Worker's Comp., however, if she feels is from her job at  [employer] she should contact them. Final Clinical Impressions(s) / UC Diagnoses   Final diagnoses:  Carpal tunnel syndrome of right wrist     Discharge Instructions     Take the prednisone as directed Take all of day one today Wear brace at night You can be seen by orthopedic without referral,  Name attached May take aleve 2 tabs morning and night after the medrol is done   ED Prescriptions    Medication Sig Dispense Auth. Provider   methylPREDNISolone (MEDROL DOSEPAK) 4 MG TBPK tablet TAD 21 tablet Raylene Everts, MD     PDMP not reviewed this encounter.   Raylene Everts, MD 05/12/20 1153

## 2021-11-05 ENCOUNTER — Encounter (HOSPITAL_COMMUNITY): Payer: Self-pay | Admitting: *Deleted

## 2021-11-05 ENCOUNTER — Ambulatory Visit (INDEPENDENT_AMBULATORY_CARE_PROVIDER_SITE_OTHER): Payer: PRIVATE HEALTH INSURANCE

## 2021-11-05 ENCOUNTER — Ambulatory Visit (HOSPITAL_COMMUNITY)
Admission: EM | Admit: 2021-11-05 | Discharge: 2021-11-05 | Disposition: A | Discharge status: Home / Self Care | Payer: PRIVATE HEALTH INSURANCE | Attending: Emergency Medicine | Admitting: Emergency Medicine

## 2021-11-05 DIAGNOSIS — M79645 Pain in left finger(s): Secondary | ICD-10-CM

## 2021-11-05 DIAGNOSIS — M25561 Pain in right knee: Secondary | ICD-10-CM

## 2021-11-05 MED ORDER — IBUPROFEN 800 MG PO TABS
800.0000 mg | ORAL_TABLET | Freq: Once | ORAL | Status: AC
  Administered 2021-11-05: 800 mg via ORAL

## 2021-11-05 MED ORDER — ALBUTEROL SULFATE HFA 108 (90 BASE) MCG/ACT IN AERS: 1.0000 | INHALATION_SPRAY | Freq: Four times a day (QID) | RESPIRATORY_TRACT | 5 refills | Status: AC | PRN

## 2021-11-05 MED ORDER — IBUPROFEN 800 MG PO TABS
ORAL_TABLET | ORAL | Status: AC
  Filled 2021-11-05: qty 1

## 2021-11-05 NOTE — Discharge Instructions (Addendum)
Ibuprofen every 6 hours.  You can elevate the leg and apply ice for 20 minutes at a time.  Use the knee sleeve if you are standing walking or bending.  Follow-up with the orthopedic clinic for further imaging and evaluation.

## 2021-11-05 NOTE — ED Provider Notes (Signed)
[place]    CSN: [id number] Arrival date & time: 11/05/21  1013     History   Chief Complaint Chief Complaint  Patient presents with   Knee Pain    HPI Melissa Villarreal is a 46 y.o. female.  Presents with 1 week history of right knee pain and swelling.  Gradually has been increasing in pain over the week. Able to bend and straighten the knee but has pain with these motions. No known injury or trauma to the area.  No history of injury to the knee.  She has tried occasional ibuprofen but does not provide any relief, has been elevating and wearing a knee sleeve.  She does work on her feet all day.  Denies any warmth to the area, no fevers.  Additionally a couple year history of left index finger feeling stuck in the morning.  She notices it does not bend completely, sometimes she has to pull it straight herself and feels a pop, feels pain. No known trauma or injury to the area. No numbness or tingling. Does not radiate up arm. History of carpal tunnel in the right hand.  Denies any back or neck pain.  Patient would like knee and finger xray today. Denies possibility of pregnancy and declines pregnancy test today.  Last menstrual cycle 6/6.  Past Medical History:  Diagnosis Date   Anemia    Asthma     There are no problems to display for this patient.   Past Surgical History:  Procedure Laterality Date   MULTIPLE TOOTH EXTRACTIONS      OB History     Gravida  4   Para  2   Term  0   Preterm  2   AB  2   Living  2      SAB  2   IAB  0   Ectopic  0   Multiple  0   Live Births               Home Medications    Prior to Admission medications   Medication Sig Start Date End Date Taking? Authorizing Provider  ibuprofen (ADVIL,MOTRIN) 200 MG tablet Take 400 mg by mouth every 6 (six) hours as needed for headache or moderate pain.   Yes [provider]  albuterol (VENTOLIN HFA) 108 (90 Base) MCG/ACT inhaler Inhale 1-2 puffs into the  lungs every 6 (six) hours as needed for wheezing or shortness of breath. 11/05/21   Tayvia Faughnan, Wells Guiles, PA-C    Family History Family History  Problem Relation Age of Onset   Healthy Mother    Cancer Maternal Grandmother        breast   Asthma Daughter    Asthma Son    Hypertension Neg Hx    Hyperlipidemia Neg Hx    Heart disease Neg Hx    Diabetes Neg Hx    Stroke Neg Hx    Mental illness Neg Hx     Social History Social History   Tobacco Use   Smoking status: Every Day    Packs/day: 0.25    Types: Cigarettes   Smokeless tobacco: Never  Vaping Use   Vaping Use: Never used  Substance Use Topics   Alcohol use: Yes    Comment: occasionally   Drug use: Yes    Types: Marijuana     Allergies   Latex and Penicillins   Review of Systems Review of Systems Per HPI  Physical Exam Triage Vital Signs  ED Triage Vitals  Enc Vitals Group     BP 11/05/21 1050 (!) 145/89     Pulse Rate 11/05/21 1050 76     Resp 11/05/21 1050 20     Temp 11/05/21 1050 98.1 F (36.7 C)     Temp Source 11/05/21 1050 Oral     SpO2 11/05/21 1050 97 %     Weight --      Height --      Head Circumference --      Peak Flow --      Pain Score 11/05/21 1051 10     Pain Loc --      Pain Edu? --      Excl. in [place]? --    No data found.  Updated Vital Signs BP (!) 145/89   Pulse 76   Temp 98.1 F (36.7 C) (Oral)   Resp 20   LMP 10/20/2021 (Exact Date)   SpO2 97%    Physical Exam Vitals and nursing note reviewed.  Constitutional:      General: She is not in acute distress.    Appearance: Normal appearance.  HENT:     Mouth/Throat:     Mouth: Mucous membranes are moist.     Pharynx: Oropharynx is clear.  Eyes:     Conjunctiva/sclera: Conjunctivae normal.  Cardiovascular:     Rate and Rhythm: Normal rate and regular rhythm.  Pulmonary:     Effort: Pulmonary effort is normal.  Musculoskeletal:        General: Tenderness present. No deformity.     Cervical back: Normal range of  motion.     Comments: Full passive range of motion of the left hand and fingers.  Unable to completely flex the left index finger actively.  Right knee tender to palpation medially.  No noted swelling or warmth.  No rash, no obvious deformity.  Full range of motion limited by pain  Skin:    Capillary Refill: Capillary refill takes less than 2 seconds.     Comments: Left hand sensation intact, cap refill less than 2 seconds, radial pulses strong.  Right leg sensation intact  Neurological:     Mental Status: She is alert and oriented to person, place, and time.     UC Treatments / Results  Labs (all labs ordered are listed, but only abnormal results are displayed) Labs Reviewed - No data to display  EKG  Radiology DG Finger Index Left  Result Date: 11/05/2021 CLINICAL DATA:  Numbness and swelling no injury EXAM: LEFT INDEX FINGER 2+V COMPARISON:  None Available. FINDINGS: Normal alignment no fracture. Mild extension of the second d IP joint could be related to ligament injury or hyper flexibility. IMPRESSION: Negative for fracture.  Mild extension of the second d IP joint. Electronically Signed   By: Franchot Gallo M.D.   On: 11/05/2021 11:54   DG Knee Complete 4 Views Right  Result Date: 11/05/2021 CLINICAL DATA:  Right knee pain EXAM: RIGHT KNEE - COMPLETE 4+ VIEW COMPARISON:  None Available. FINDINGS: No evidence of fracture, dislocation, or joint effusion. No evidence of arthropathy or other focal bone abnormality. Soft tissue edema anteriorly. IMPRESSION: 1. No fracture or dislocation of the right knee. Joint spaces are well preserved. No knee joint effusion. 2.  Soft tissue edema anteriorly. Electronically Signed   By: Delanna Ahmadi M.D.   On: 11/05/2021 11:52    Procedures Procedures   Medications Ordered in UC Medications  ibuprofen (ADVIL) tablet 800 mg (800  mg Oral Given 11/05/21 1137)    Initial Impression / Assessment and Plan / UC Course  I have reviewed the triage vital  signs and the nursing notes.  Pertinent labs & imaging results that were available during my care of the patient were reviewed by me and considered in my medical decision making (see chart for details).  Left index finger x-ray negative for fracture, radiologist review mild extension of second DIP joint. Possibly trigger finger vs ligament injury.  She has full passive range of motion of the left index finger.  Isolated each joint and was able to move them all.  Some pain with active flexion of the index finger but cannot bend it all the way down.  Discussed resting, light stretching, orthopedic follow-up.  She can try buddy taping or night splinting in the meantime.  Right knee x-ray negative with anterior soft tissue edema. Dose of ibuprofen given in clinic.  Knee sleeve provided for compression.  Providing orthopedic clinic contact information for further imaging. RICE therapy.  Ibuprofen every 6 hours. Return precautions discussed. Patient agrees to plan and is discharged in stable condition.  Final Clinical Impressions(s) / UC Diagnoses   Final diagnoses:  Acute pain of right knee  Finger pain, left     Discharge Instructions      Ibuprofen every 6 hours.  You can elevate the leg and apply ice for 20 minutes at a time.  Use the knee sleeve if you are standing walking or bending.  Follow-up with the orthopedic clinic for further imaging and evaluation.     ED Prescriptions     Medication Sig Dispense Auth. Provider   albuterol (VENTOLIN HFA) 108 (90 Base) MCG/ACT inhaler Inhale 1-2 puffs into the lungs every 6 (six) hours as needed for wheezing or shortness of breath. 1 each Kyheem Bathgate, Wells Guiles, PA-C      PDMP not reviewed this encounter.   Agueda Houpt, Vernice Jefferson 11/05/21 1234

## 2021-11-05 NOTE — ED Triage Notes (Signed)
C/O right knee pain with gradual onset over past week without any known injuries. Reports working on her feet a lot with heavy boots on concrete floor. Has been wearing knee brace without relief. C/O right hand numbness intermittently "over past few yrs"; started with left index finger numbness, swelling, and inability to have full ROM over past 2 wks. No known injuries. BUE digits all warm with prompt cap refill.

## 2022-04-27 ENCOUNTER — Ambulatory Visit (HOSPITAL_COMMUNITY)
Admission: EM | Admit: 2022-04-27 | Discharge: 2022-04-27 | Disposition: A | Discharge status: Home / Self Care | Payer: 59 | Attending: Emergency Medicine | Admitting: Emergency Medicine

## 2022-04-27 ENCOUNTER — Ambulatory Visit (INDEPENDENT_AMBULATORY_CARE_PROVIDER_SITE_OTHER): Payer: 59

## 2022-04-27 ENCOUNTER — Encounter (HOSPITAL_COMMUNITY): Payer: Self-pay

## 2022-04-27 DIAGNOSIS — M7989 Other specified soft tissue disorders: Secondary | ICD-10-CM | POA: Diagnosis not present

## 2022-04-27 DIAGNOSIS — M79641 Pain in right hand: Secondary | ICD-10-CM

## 2022-04-27 MED ORDER — IBUPROFEN 800 MG PO TABS
800.0000 mg | ORAL_TABLET | Freq: Three times a day (TID) | ORAL | 0 refills | Status: DC | PRN
Start: 2022-04-27 — End: 2023-03-04

## 2022-04-27 MED ORDER — KETOROLAC TROMETHAMINE 30 MG/ML IJ SOLN
INTRAMUSCULAR | Status: AC
  Filled 2022-04-27: qty 1

## 2022-04-27 MED ORDER — KETOROLAC TROMETHAMINE 60 MG/2ML IM SOLN
30.0000 mg | Freq: Once | INTRAMUSCULAR | Status: AC
  Administered 2022-04-27: 30 mg via INTRAMUSCULAR

## 2022-04-27 NOTE — ED Triage Notes (Signed)
Pt is here for bilateral hand pain  with swelling and pain x 105month

## 2022-04-27 NOTE — Discharge Instructions (Addendum)
X-Ray showed some arthritis changes, but no other concerns.   I recommend using ibuprofen, up to 800 mg every 6 hours to reduce pain and inflammation. Apply ice to the hands for 20 minutes at a time, a few times daily. Continue to elevate when you can.  Please follow up with the hand specialists if symptoms persist. You can call to make an appointment, or go to their walk in hours.

## 2022-04-27 NOTE — ED Provider Notes (Signed)
[place]    CSN: [id number] Arrival date & time: 04/27/22  1146      History   Chief Complaint Chief Complaint  Patient presents with   Hand Pain    HPI DEL WISEMAN is a 46 y.o. female.  Presents with 2 month history of bilateral hand pain and swelling It comes and goes, but swelling is more often than not Today she feels it in both hands, but more obvious in the right hand  She tried ice and heat Took tylenol yesterday   Denies any scratches, cuts, bites, injuries, injections No injuries or trauma No warmth, erythema, drainage or abscess in the area  No new medications No swelling in the legs. Denies swelling in lips/tongue/throat, or shortness of breath  Past Medical History:  Diagnosis Date   Anemia    Asthma     There are no problems to display for this patient.   Past Surgical History:  Procedure Laterality Date   MULTIPLE TOOTH EXTRACTIONS      OB History     Gravida  4   Para  2   Term  0   Preterm  2   AB  2   Living  2      SAB  2   IAB  0   Ectopic  0   Multiple  0   Live Births               Home Medications    Prior to Admission medications   Medication Sig Start Date End Date Taking? Authorizing Provider  ibuprofen (ADVIL) 800 MG tablet Take 1 tablet (800 mg total) by mouth 3 (three) times daily as needed. 04/27/22  Yes Tymesha Ditmore, Wells Guiles, PA-C  albuterol (VENTOLIN HFA) 108 (90 Base) MCG/ACT inhaler Inhale 1-2 puffs into the lungs every 6 (six) hours as needed for wheezing or shortness of breath. 11/05/21   Alonzo Loving, Wells Guiles, PA-C    Family History Family History  Problem Relation Age of Onset   Healthy Mother    Cancer Maternal Grandmother        breast   Asthma Daughter    Asthma Son    Hypertension Neg Hx    Hyperlipidemia Neg Hx    Heart disease Neg Hx    Diabetes Neg Hx    Stroke Neg Hx    Mental illness Neg Hx     Social History Social History   Tobacco Use   Smoking status: Every  Day    Packs/day: 0.25    Types: Cigarettes   Smokeless tobacco: Never  Vaping Use   Vaping Use: Never used  Substance Use Topics   Alcohol use: Yes    Comment: occasionally   Drug use: Yes    Types: Marijuana     Allergies   Latex and Penicillins   Review of Systems Review of Systems Per HPI  Physical Exam Triage Vital Signs ED Triage Vitals  Enc Vitals Group     BP 04/27/22 1355 (!) 139/92     Pulse Rate 04/27/22 1355 91     Resp 04/27/22 1355 16     Temp 04/27/22 1355 98 F (36.7 C)     Temp Source 04/27/22 1355 Oral     SpO2 04/27/22 1355 98 %     Weight --      Height --      Head Circumference --      Peak Flow --      Pain  Score 04/27/22 1352 10     Pain Loc --      Pain Edu? --      Excl. in [place]? --    No data found.  Updated Vital Signs BP (!) 139/92 (BP Location: Right Arm)   Pulse 91   Temp 98 F (36.7 C) (Oral)   Resp 16   LMP 04/10/2022   SpO2 98%    Physical Exam Vitals and nursing note reviewed.  Constitutional:      General: She is not in acute distress.    Appearance: Normal appearance.  Cardiovascular:     Rate and Rhythm: Normal rate and regular rhythm.     Pulses: Normal pulses.     Heart sounds: Normal heart sounds.  Pulmonary:     Effort: Pulmonary effort is normal.     Breath sounds: Normal breath sounds.  Musculoskeletal:        General: Swelling present. No tenderness.     Comments: Mildly swollen R hand. Decreased ROM right hand from swelling. Better in the left hand. Sensation intact distally with cap refill < 2 seconds. Strong radial pulse. There is no tenderness to palpation.  Skin:    General: Skin is warm and dry.     Capillary Refill: Capillary refill takes less than 2 seconds.     Comments: No rash, abrasion, bruising, warmth, erythema, wounds, etc. Skin is warm and dry, clear  Neurological:     Mental Status: She is alert.     UC Treatments / Results  Labs (all labs ordered are listed, but only abnormal  results are displayed) Labs Reviewed - No data to display  EKG  Radiology DG Hand Complete Right  Result Date: 04/27/2022 CLINICAL DATA:  Right hand swelling for 1 month, pain EXAM: RIGHT HAND - COMPLETE 3+ VIEW COMPARISON:  None Available. FINDINGS: Frontal, oblique, and lateral views of the right hand are obtained on 3 images. No acute fracture, subluxation, or dislocation. There is mild multifocal osteoarthritis, greatest in the radial aspect of the wrist, first metacarpophalangeal joint, and throughout the remaining distal interphalangeal joints. Bones are normally mineralized. No erosive changes. Dorsal soft tissue swelling at the level of the metacarpophalangeal joints. IMPRESSION: 1. No acute bony abnormality. 2. Mild diffuse osteoarthritis. 3. Dorsal soft tissue swelling at the level the metacarpophalangeal joints. Electronically Signed   By: Randa Ngo M.D.   On: 04/27/2022 14:41    Procedures Procedures   Medications Ordered in UC Medications  ketorolac (TORADOL) injection 30 mg (30 mg Intramuscular Given 04/27/22 1446)    Initial Impression / Assessment and Plan / UC Course  I have reviewed the triage vital signs and the nursing notes.  Pertinent labs & imaging results that were available during my care of the patient were reviewed by me and considered in my medical decision making (see chart for details).  Toradol injection given in clinic, patient reports improvement in pain and swelling.  Right hand xray obtained, diffuse osteoarthritis and dorsal soft tissue swelling of the fingers, no bony abnormality  Discussed unknown etiology, she does have a job where she works continuously with her hands. Consider overuse/arthritic changes No sign of infection at this time. No concerning systemic symptoms or red flags. I suggest ibuprofen, ice, elevating, and following up with orthopedics. Discussed short course of prednisone but patient does not like as it prolongs her  menstrual cycle. Return precautions discussed. Patient agrees to plan  Final Clinical Impressions(s) / UC Diagnoses   Final  diagnoses:  Swelling of right hand     Discharge Instructions      X-Ray showed some arthritis changes, but no other concerns.   I recommend using ibuprofen, up to 800 mg every 6 hours to reduce pain and inflammation. Apply ice to the hands for 20 minutes at a time, a few times daily. Continue to elevate when you can.  Please follow up with the hand specialists if symptoms persist. You can call to make an appointment, or go to their walk in hours.     ED Prescriptions     Medication Sig Dispense Auth. Provider   ibuprofen (ADVIL) 800 MG tablet Take 1 tablet (800 mg total) by mouth 3 (three) times daily as needed. 21 tablet Lakeria Starkman, Wells Guiles, PA-C      PDMP not reviewed this encounter.   Les Pou, Hershal Coria 04/27/22 0037

## 2023-03-04 ENCOUNTER — Encounter (HOSPITAL_COMMUNITY): Payer: Self-pay | Admitting: *Deleted

## 2023-03-04 ENCOUNTER — Other Ambulatory Visit: Payer: Self-pay

## 2023-03-04 ENCOUNTER — Ambulatory Visit (HOSPITAL_COMMUNITY)
Admission: EM | Admit: 2023-03-04 | Discharge: 2023-03-04 | Disposition: A | Discharge status: Home / Self Care | Payer: Self-pay | Attending: Internal Medicine | Admitting: Internal Medicine

## 2023-03-04 ENCOUNTER — Ambulatory Visit (INDEPENDENT_AMBULATORY_CARE_PROVIDER_SITE_OTHER): Payer: Self-pay

## 2023-03-04 DIAGNOSIS — M25511 Pain in right shoulder: Secondary | ICD-10-CM

## 2023-03-04 MED ORDER — NAPROXEN 500 MG PO TABS: 500.0000 mg | ORAL_TABLET | Freq: Two times a day (BID) | ORAL | 0 refills | Status: AC

## 2023-03-04 NOTE — Discharge Instructions (Addendum)
The x-ray reading we discussed is preliminary. Your x-ray will be read by a radiologist in next few hours. If there is a discrepancy, you will be contacted, and instructed on a new plan for you care.

## 2023-03-04 NOTE — ED Provider Notes (Signed)
[facility]    CSN: [id number] Arrival date & time: 03/04/23  1050      History   Chief Complaint Chief Complaint  Patient presents with   Shoulder Pain    HPI Melissa Villarreal is a 47 y.o. female.    Shoulder Pain Right shoulder pain gradual onset about 3 weeks ago described as a pinching pain in the front and back of her shoulder, pain is constant worse with right shoulder movements or leaning forward onto her right elbow.  Rates pain 8 out of 10 .denies known injury, neck pain, paresthesias, elbow pain, wrist pain, weakness.  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 She is right-handed. She took over-the-counter Tylenol without relief.  Past Medical History:  Diagnosis Date   Anemia    Asthma     There are no problems to display for this patient.   Past Surgical History:  Procedure Laterality Date   MULTIPLE TOOTH EXTRACTIONS      OB History     Gravida  4   Para  2   Term  0   Preterm  2   AB  2   Living  2      SAB  2   IAB  0   Ectopic  0   Multiple  0   Live Births               Home Medications    Prior to Admission medications   Medication Sig Start Date End Date Taking? Authorizing Provider  albuterol (VENTOLIN HFA) 108 (90 Base) MCG/ACT inhaler Inhale 1-2 puffs into the lungs every 6 (six) hours as needed for wheezing or shortness of breath. 11/05/21  Yes Rising, Lurena Joiner, PA-C  ibuprofen (ADVIL) 800 MG tablet Take 1 tablet (800 mg total) by mouth 3 (three) times daily as needed. 04/27/22   Rising, Lurena Joiner, PA-C    Family History Family History  Problem Relation Age of Onset   Healthy Mother    Cancer Maternal Grandmother        breast   Asthma Daughter    Asthma Son    Hypertension Neg Hx    Hyperlipidemia Neg Hx    Heart disease Neg Hx    Diabetes Neg Hx    Stroke Neg Hx    Mental  illness Neg Hx     Social History Social History   Tobacco Use   Smoking status: Every Day    Current packs/day: 0.25    Types: Cigarettes   Smokeless tobacco: Never  Vaping Use   Vaping status: Never Used  Substance Use Topics   Alcohol use: Yes    Comment: occasionally   Drug use: Yes    Types: Marijuana     Allergies   Latex and Penicillins   Review of Systems Review of Systems   Physical Exam Triage Vital Signs ED Triage Vitals  Encounter Vitals Group     BP 03/04/23 1153 (!) 144/81     Systolic BP Percentile --      Diastolic BP Percentile --      Pulse Rate 03/04/23 1153 79     Resp 03/04/23 1153 18     Temp 03/04/23 1153 98.1 F (36.7 C)     Temp src --      SpO2 03/04/23 1153 99 %  Weight --      Height --      Head Circumference --      Peak Flow --      Pain Score 03/04/23 1151 8     Pain Loc --      Pain Education --      Exclude from Growth Chart --    No data found.  Updated Vital Signs BP (!) 144/81   Pulse 79   Temp 98.1 F (36.7 C)   Resp 18   LMP 02/04/2023   SpO2 99%   Visual Acuity Right Eye Distance:   Left Eye Distance:   Bilateral Distance:    Right Eye Near:   Left Eye Near:    Bilateral Near:     Physical Exam Vitals and nursing note reviewed.  Constitutional:      Appearance: She is not ill-appearing.  HENT:     Head: Normocephalic.     Right Ear: Tympanic membrane and ear canal normal.     Left Ear: Tympanic membrane and ear canal normal.  Eyes:     Conjunctiva/sclera: Conjunctivae normal.  Cardiovascular:     Rate and Rhythm: Normal rate.     Heart sounds: Normal heart sounds.  Pulmonary:     Effort: Pulmonary effort is normal.     Breath sounds: Normal breath sounds.  Musculoskeletal:     Right shoulder: Tenderness (Mild diffuse tenderness) present. No deformity or crepitus. Normal range of motion. Normal strength.     Right upper arm: Normal.     Right elbow: Normal.     Right wrist: Normal.      Cervical back: Neck supple. No tenderness.  Neurological:     Mental Status: She is alert.      UC Treatments / Results  Labs (all labs ordered are listed, but only abnormal results are displayed) Labs Reviewed - No data to display  EKG   Radiology No results found.  Procedures Procedures (including critical care time)  Medications Ordered in UC Medications - No data to display  Initial Impression / Assessment and Plan / UC Course  I have reviewed the triage vital signs and the nursing notes.  Pertinent labs & imaging results that were available during my care of the patient were reviewed by me and considered in my medical decision making (see chart for details).     47 year old female with right shoulder pain for 3 weeks no known injury although does have a repetitive job involving a lot of lifting.  On exam she has right shoulder tenderness no deformity, range of motion and good strength.  Will check x-ray. Independently viewed by me normal. Final Clinical Impressions(s) / UC Diagnoses   Final diagnoses:  None   Discharge Instructions   None    ED Prescriptions   None    PDMP not reviewed this encounter.   Roselind, Desantos, [place] 03/04/23 1232

## 2023-03-04 NOTE — ED Triage Notes (Signed)
Pt reports a pinching pain to RT shoulder for 3 weeks.

## 2023-05-31 ENCOUNTER — Ambulatory Visit (HOSPITAL_COMMUNITY)
Admission: EM | Admit: 2023-05-31 | Discharge: 2023-05-31 | Disposition: A | Discharge status: Home / Self Care | Payer: Self-pay | Attending: Neurology | Admitting: Neurology

## 2023-05-31 ENCOUNTER — Encounter (HOSPITAL_COMMUNITY): Payer: Self-pay

## 2023-05-31 DIAGNOSIS — J069 Acute upper respiratory infection, unspecified: Secondary | ICD-10-CM

## 2023-05-31 LAB — POC COVID19/FLU A&B COMBO
Covid Antigen, POC: NEGATIVE
Influenza A Antigen, POC: NEGATIVE
Influenza B Antigen, POC: NEGATIVE

## 2023-05-31 MED ORDER — ALBUTEROL SULFATE HFA 108 (90 BASE) MCG/ACT IN AERS
2.0000 | INHALATION_SPRAY | Freq: Once | RESPIRATORY_TRACT | Status: AC
  Administered 2023-05-31: 2 via RESPIRATORY_TRACT

## 2023-05-31 MED ORDER — ALBUTEROL SULFATE HFA 108 (90 BASE) MCG/ACT IN AERS
INHALATION_SPRAY | RESPIRATORY_TRACT | Status: AC
  Filled 2023-05-31: qty 6.7

## 2023-05-31 MED ORDER — BENZONATATE 100 MG PO CAPS: 100.0000 mg | ORAL_CAPSULE | Freq: Three times a day (TID) | ORAL | 0 refills | Status: AC

## 2023-05-31 MED ORDER — PROMETHAZINE-DM 6.25-15 MG/5ML PO SYRP: 2.5000 mL | ORAL_SOLUTION | Freq: Four times a day (QID) | ORAL | 0 refills | Status: AC | PRN

## 2023-05-31 MED ORDER — FLUTICASONE PROPIONATE 50 MCG/ACT NA SUSP: 2.0000 | Freq: Every day | NASAL | 2 refills | Status: AC

## 2023-05-31 NOTE — ED Triage Notes (Signed)
 Pt states cough ,congestion,sob and headaches since yesterday. States she has been taking dayquil and nyquil at home.

## 2023-05-31 NOTE — ED Provider Notes (Signed)
 [facility]    CSN: [id number] Arrival date & time: 05/31/23  9192      History   Chief Complaint Chief Complaint  Patient presents with   Cough    HPI Melissa Villarreal is a 48 y.o. female presenting with cold symptoms starting yesterday. Woke up 1/13 feeling sick- cough weakness, headache, lost of mucus/drainage.    She woke up on 1/13 feeling weak with a headache, shortness of breath, and having lots of mucus drainage.  She does think some of her coworkers may be sick as well but she is not sure. She is short of breath with when walking. She is having a worse cough at night.  She has been using NyQuil and DayQuil.  She has not been taking any other over-the-counter medications.  She describes her headache as pressure in her forehead and around her nose and she has had clear drainage from her nose.  The history is provided by the patient.    Past Medical History:  Diagnosis Date   Anemia    Asthma     There are no active problems to display for this patient.   Past Surgical History:  Procedure Laterality Date   MULTIPLE TOOTH EXTRACTIONS      OB History     Gravida  4   Para  2   Term  0   Preterm  2   AB  2   Living  2      SAB  2   IAB  0   Ectopic  0   Multiple  0   Live Births               Home Medications    Prior to Admission medications   Medication Sig Start Date End Date Taking? Authorizing Provider  benzonatate  (TESSALON ) 100 MG capsule Take 1 capsule (100 mg total) by mouth every 8 (eight) hours. 05/31/23  Yes Kayne Yuhas, Jorene, NP  fluticasone  (FLONASE ) 50 MCG/ACT nasal spray Place 2 sprays into both nostrils daily. 05/31/23  Yes Remi Jorene, NP  promethazine -dextromethorphan (PROMETHAZINE -DM) 6.25-15 MG/5ML syrup Take 2.5 mLs by mouth 4 (four) times daily as needed for cough. 05/31/23  Yes Remi Jorene, NP  albuterol  (VENTOLIN  HFA) 108 (90 Base) MCG/ACT inhaler Inhale 1-2 puffs into the lungs every 6 (six) hours as  needed for wheezing or shortness of breath. 11/05/21   Rising, Asberry, PA-C    Family History Family History  Problem Relation Age of Onset   Healthy Mother    Cancer Maternal Grandmother        breast   Asthma Daughter    Asthma Son    Hypertension Neg Hx    Hyperlipidemia Neg Hx    Heart disease Neg Hx    Diabetes Neg Hx    Stroke Neg Hx    Mental illness Neg Hx     Social History Social History   Tobacco Use   Smoking status: Every Day    Current packs/day: 0.25    Types: Cigarettes   Smokeless tobacco: Never  Vaping Use   Vaping status: Never Used  Substance Use Topics   Alcohol use: Yes    Comment: occasionally   Drug use: Yes    Types: Marijuana     Allergies   Latex and Penicillins   Review of Systems Review of Systems   Physical Exam Triage Vital Signs ED Triage Vitals  Encounter Vitals Group     BP  Systolic BP Percentile      Diastolic BP Percentile      Pulse      Resp      Temp      Temp src      SpO2      Weight      Height      Head Circumference      Peak Flow      Pain Score      Pain Loc      Pain Education      Exclude from Growth Chart    No data found.  Updated Vital Signs BP (!) 153/81 (BP Location: Left Arm)   Pulse 81   Temp 98.2 F (36.8 C) (Oral)   Resp 16   LMP 05/30/2023 (Exact Date)   SpO2 98%   Visual Acuity Right Eye Distance:   Left Eye Distance:   Bilateral Distance:    Right Eye Near:   Left Eye Near:    Bilateral Near:     Physical Exam Constitutional:      Appearance: She is obese.  HENT:     Nose: Congestion and rhinorrhea present. Rhinorrhea is clear.     Right Sinus: Maxillary sinus tenderness and frontal sinus tenderness present.     Left Sinus: Maxillary sinus tenderness and frontal sinus tenderness present.     Mouth/Throat:     Mouth: Mucous membranes are moist.  Cardiovascular:     Rate and Rhythm: Normal rate and regular rhythm.     Pulses: Normal pulses.  Pulmonary:      Effort: Pulmonary effort is normal.     Breath sounds: Normal breath sounds.  Musculoskeletal:     Cervical back: Normal range of motion.  Neurological:     Mental Status: She is alert.      UC Treatments / Results  Labs (all labs ordered are listed, but only abnormal results are displayed) Labs Reviewed  POC COVID19/FLU A&B COMBO    EKG   Radiology No results found.  Procedures Procedures (including critical care time)  Medications Ordered in UC Medications  albuterol  (VENTOLIN  HFA) 108 (90 Base) MCG/ACT inhaler 2 puff (2 puffs Inhalation Given 05/31/23 0927)    Initial Impression / Assessment and Plan / UC Course  I have reviewed the triage vital signs and the nursing notes.  Pertinent labs & imaging results that were available during my care of the patient were reviewed by me and considered in my medical decision making (see chart for details).  Suspect viral URI, viral syndrome.  Strep/viral testing:   Physical exam findings reassuring, vital signs hemodynamically stable, and lungs clear, therefore deferred imaging of the chest.  Advised supportive care/prescriptions for symptomatic relief as outlined in AVS.      Final Clinical Impressions(s) / UC Diagnoses   Final diagnoses:  Viral URI with cough     Discharge Instructions      You have a viral illness which will improve on its own with rest, fluids, and medications to help with your symptoms.  Tylenol , guaifenesin  DM, flonase , tessalon  pearls, and saline nasal sprays may help relieve symptoms. Medications sent to pharmacy.   Two teaspoons of honey in 1 cup of warm water every 4-6 hours may help with throat pains.  Humidifier in room at nighttime may help soothe cough (clean well daily).   For chest pain, shortness of breath, inability to keep food or fluids down without vomiting, fever that does not respond to tylenol   or motrin , or any other severe symptoms, please go to the ER for further  evaluation. Return to urgent care as needed, otherwise follow-up with PCP.          ED Prescriptions     Medication Sig Dispense Auth. Provider   promethazine -dextromethorphan (PROMETHAZINE -DM) 6.25-15 MG/5ML syrup Take 2.5 mLs by mouth 4 (four) times daily as needed for cough. 118 mL Remi Pippin, NP   benzonatate  (TESSALON ) 100 MG capsule Take 1 capsule (100 mg total) by mouth every 8 (eight) hours. 21 capsule Remi Pippin, NP   fluticasone  (FLONASE ) 50 MCG/ACT nasal spray Place 2 sprays into both nostrils daily. 15.8 mL Remi Pippin, NP      PDMP not reviewed this encounter.   Remi Pippin, NP 05/31/23 [phone]

## 2023-05-31 NOTE — Discharge Instructions (Addendum)
 You have a viral illness which will improve on its own with rest, fluids, and medications to help with your symptoms.  Tylenol , guaifenesin  DM, flonase , tessalon  pearls, and saline nasal sprays may help relieve symptoms. Medications sent to pharmacy.   Two teaspoons of honey in 1 cup of warm water every 4-6 hours may help with throat pains.  Humidifier in room at nighttime may help soothe cough (clean well daily).   For chest pain, shortness of breath, inability to keep food or fluids down without vomiting, fever that does not respond to tylenol  or motrin , or any other severe symptoms, please go to the ER for further evaluation. Return to urgent care as needed, otherwise follow-up with PCP.

## 2024-05-29 IMAGING — DX DG KNEE COMPLETE 4+V*R*
5 series · 5 of 5 positions shown · non-contrast
Comparison: None Available.

CLINICAL DATA: Right knee pain

EXAM:
RIGHT KNEE - COMPLETE 4+ VIEW

[knee ap]
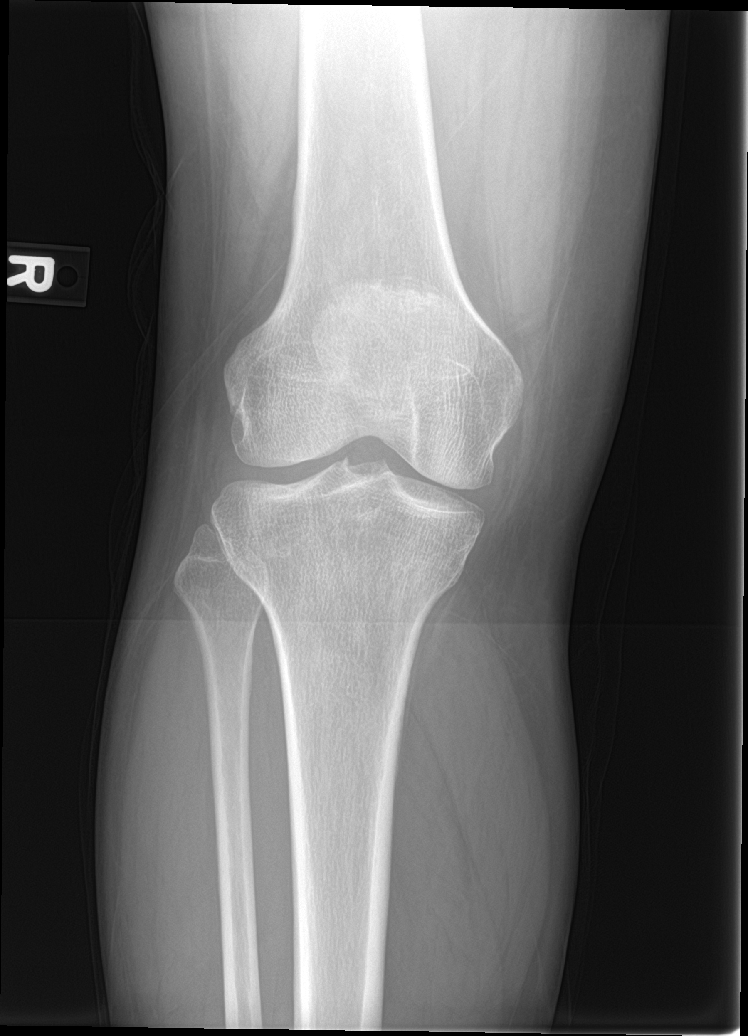

[knee obl (1 of 2)]
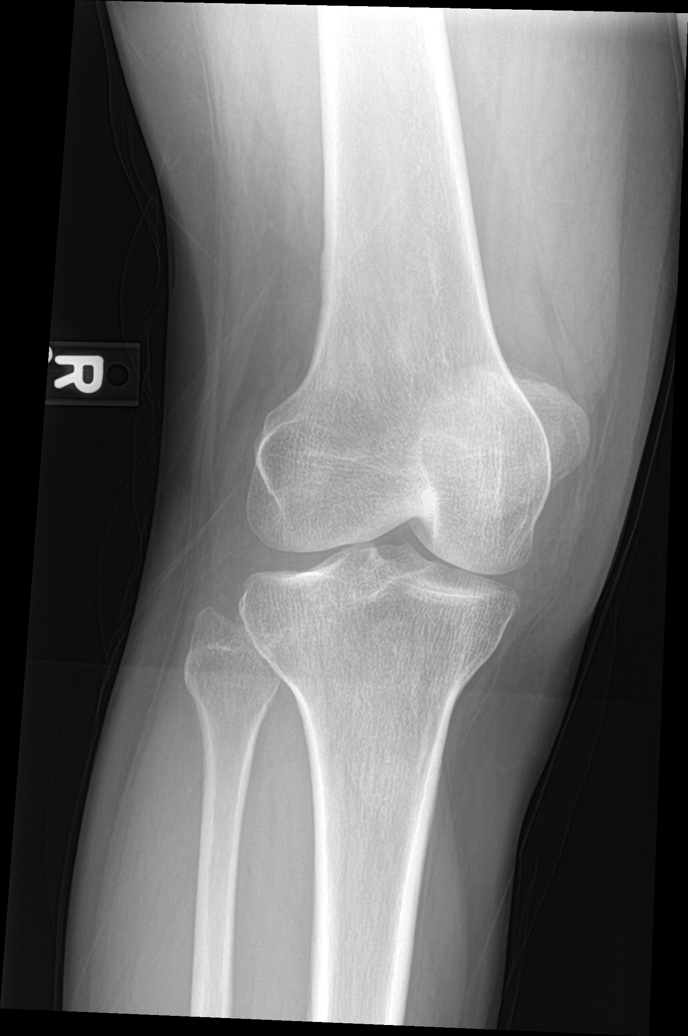

[knee obl (2 of 2)]
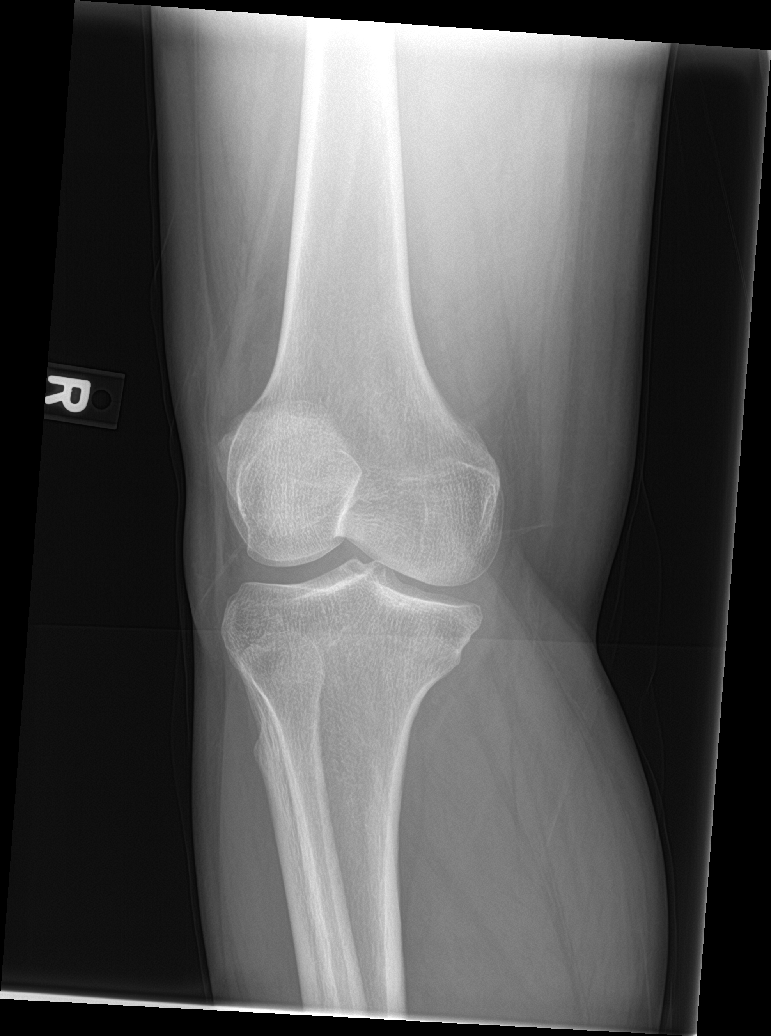

[knee lat]
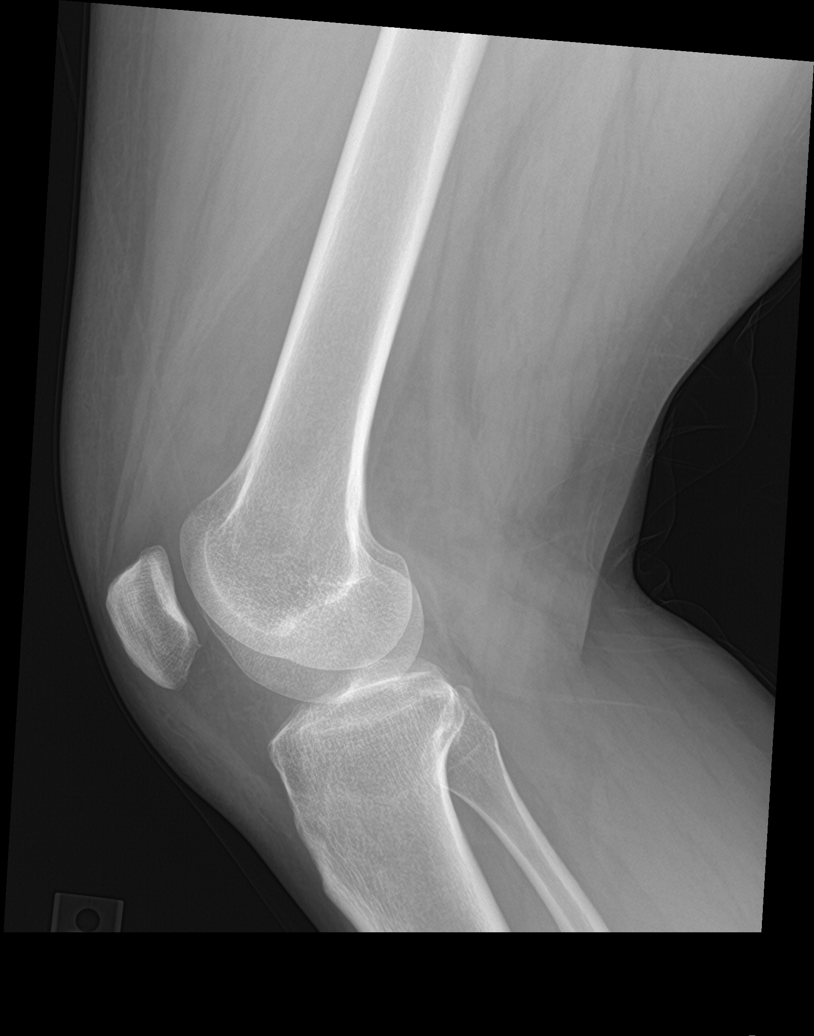

[knee sunrise]
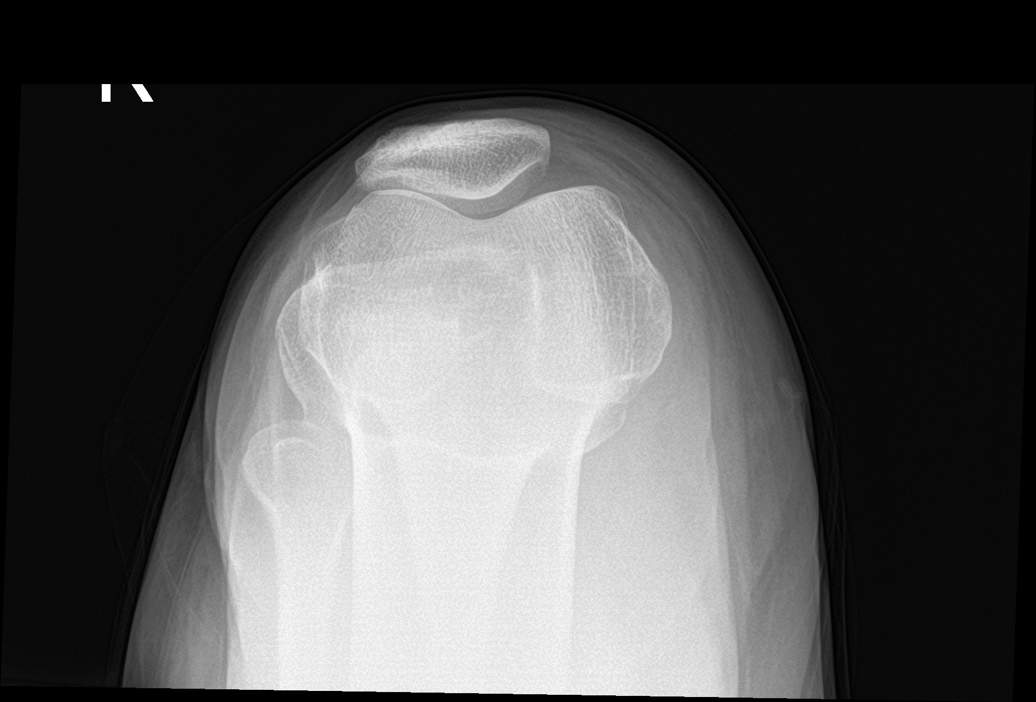

[5 of 5 positions shown; findings below may reference images not displayed]

FINDINGS: No evidence of fracture, dislocation, or joint effusion. No evidence
of arthropathy or other focal bone abnormality. Soft tissue edema
anteriorly.
IMPRESSION: 1. No fracture or dislocation of the right knee. Joint spaces are
well preserved. No knee joint effusion.

2.  Soft tissue edema anteriorly.

## 2024-05-29 IMAGING — DX DG FINGER INDEX 2+V*L*
3 series · 3 of 3 positions shown · non-contrast
Comparison: None Available.

CLINICAL DATA: Numbness and swelling no injury

EXAM:
LEFT INDEX FINGER 2+V

[finger ap]
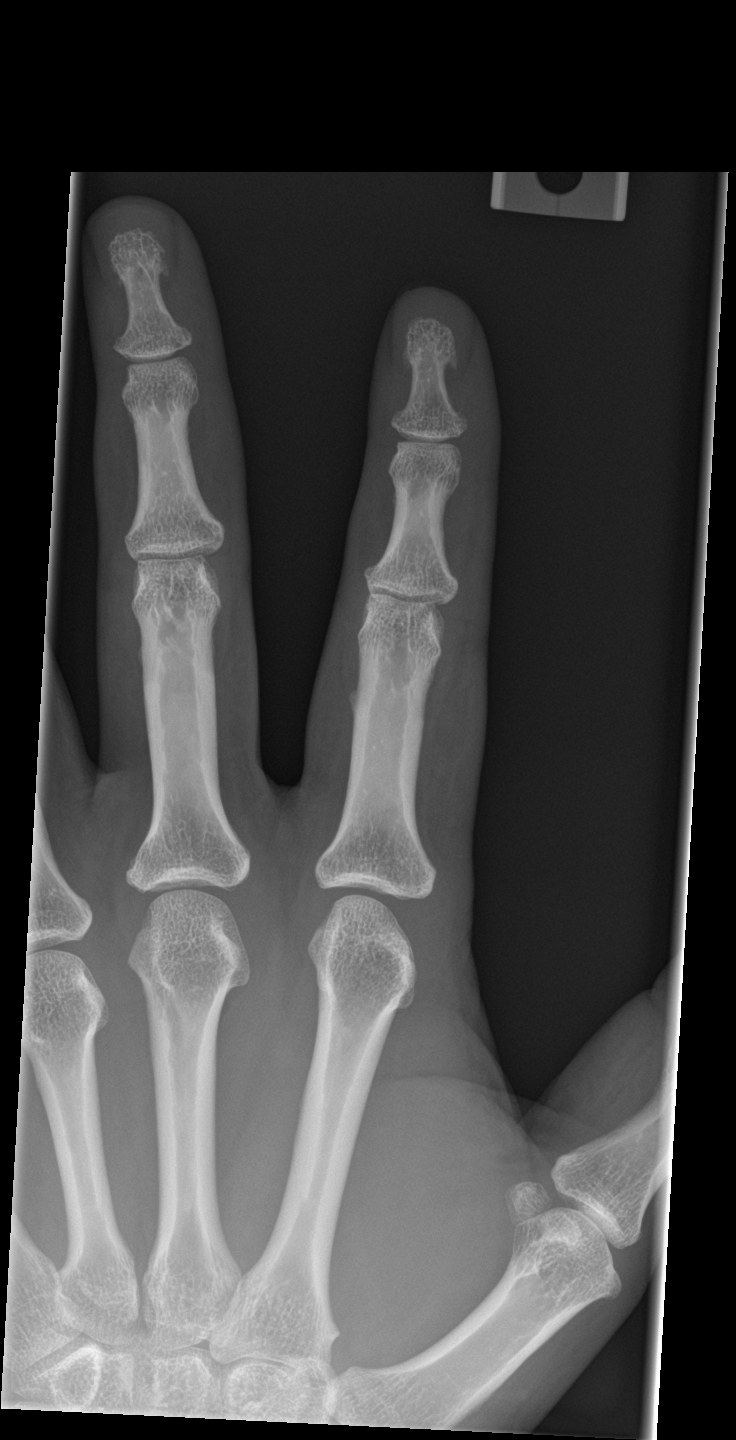

[finger obl]
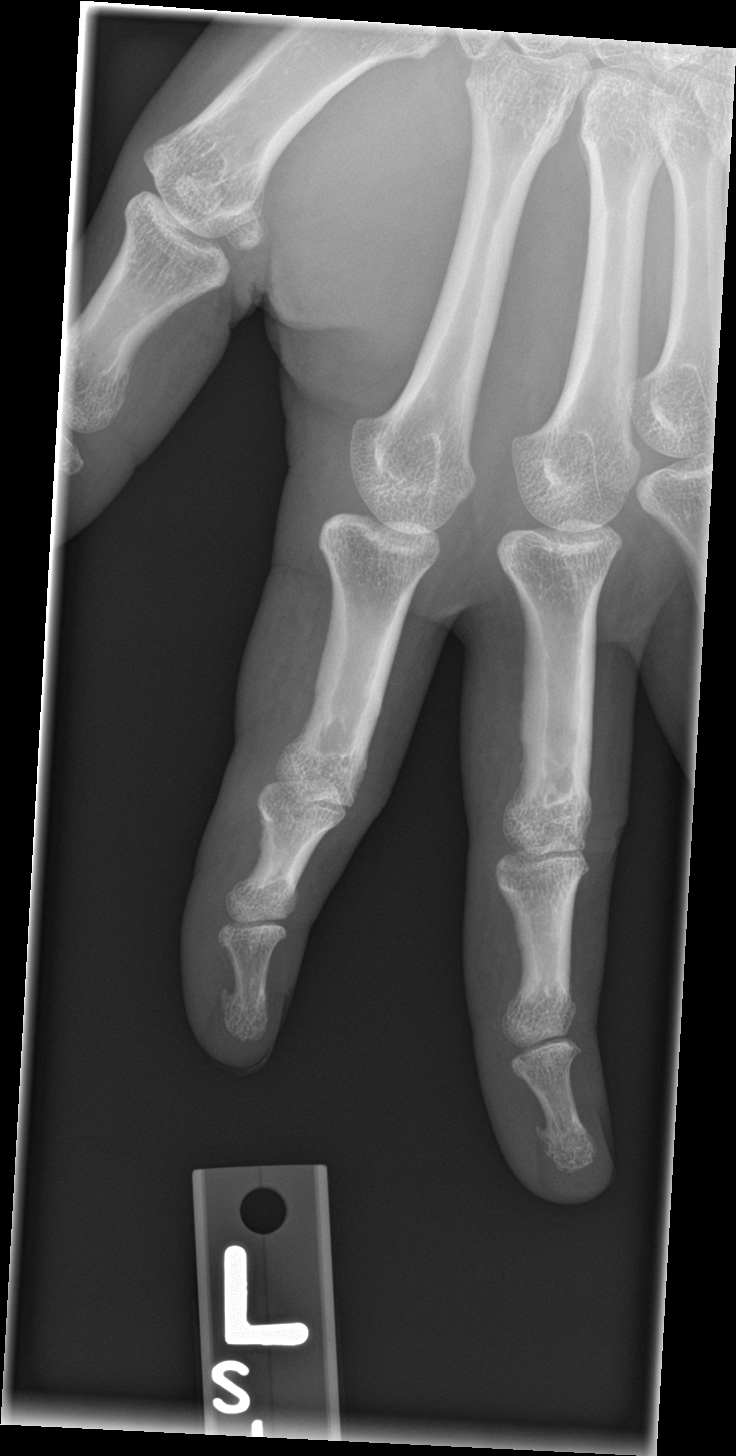

[finger lat]
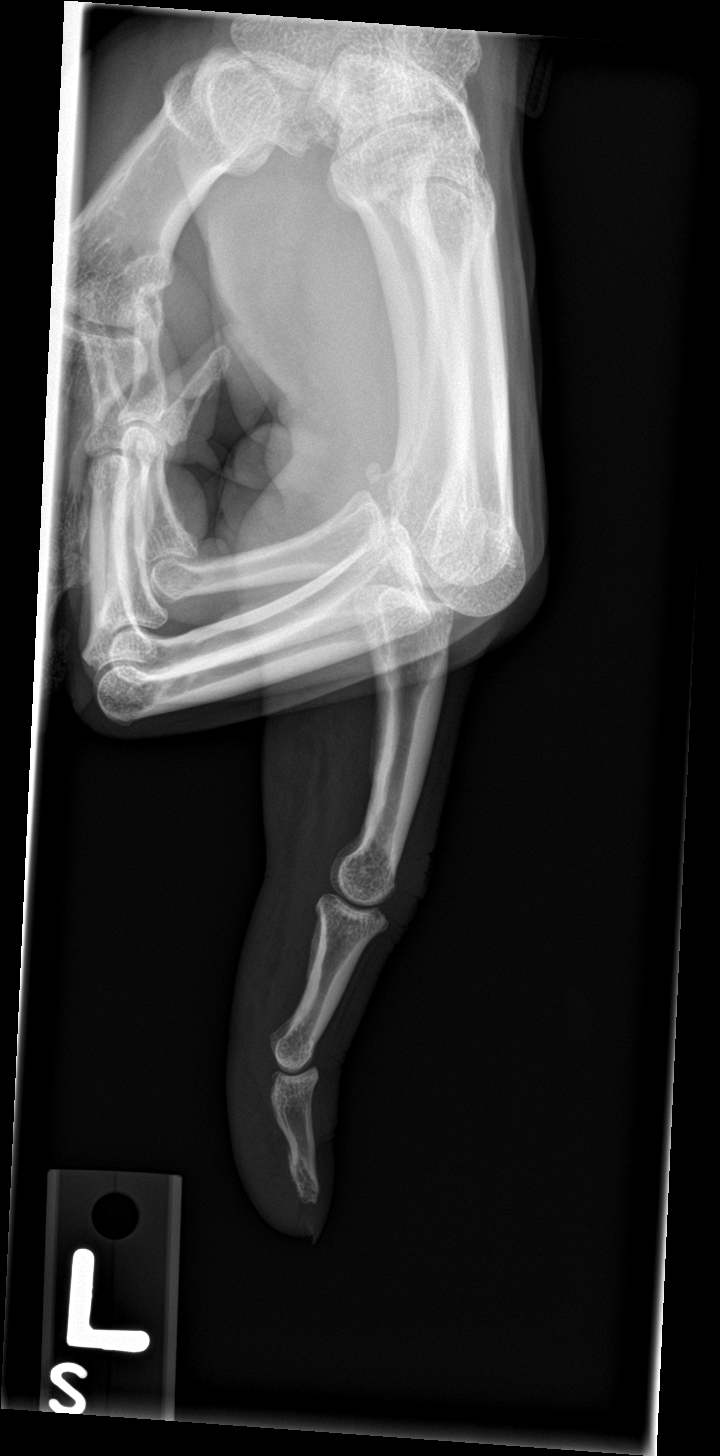

[3 of 3 positions shown; findings below may reference images not displayed]

FINDINGS: Normal alignment no fracture. Mild extension of the second d IP
joint could be related to ligament injury or hyper flexibility.
IMPRESSION: Negative for fracture.  Mild extension of the second d IP joint.
